# Patient Record
Sex: Male | Born: 1980 | Race: White | Hispanic: No | Marital: Single | Smoking: Current every day smoker
Health system: Southern US, Community
[De-identification: ages and names within clinical notes are randomized; demographics above are authoritative.]

## PROBLEM LIST (undated history)

## (undated) DIAGNOSIS — F32A Depression, unspecified: Secondary | ICD-10-CM

## (undated) DIAGNOSIS — F329 Major depressive disorder, single episode, unspecified: Secondary | ICD-10-CM

## (undated) DIAGNOSIS — N200 Calculus of kidney: Secondary | ICD-10-CM

---
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
## 2002-10-14 ENCOUNTER — Encounter: Payer: Self-pay | Admitting: *Deleted

## 2002-10-14 ENCOUNTER — Emergency Department (HOSPITAL_COMMUNITY): Admission: EM | Admit: 2002-10-14 | Discharge: 2002-10-14 | Payer: Self-pay | Admitting: *Deleted

## 2009-04-17 ENCOUNTER — Emergency Department (HOSPITAL_COMMUNITY): Admission: EM | Admit: 2009-04-17 | Discharge: 2009-04-17 | Payer: Self-pay | Admitting: Emergency Medicine

## 2009-09-30 ENCOUNTER — Emergency Department (HOSPITAL_COMMUNITY): Admission: EM | Admit: 2009-09-30 | Discharge: 2009-09-30 | Payer: Self-pay | Admitting: Emergency Medicine

## 2009-10-02 ENCOUNTER — Emergency Department (HOSPITAL_COMMUNITY): Admission: EM | Admit: 2009-10-02 | Discharge: 2009-10-02 | Payer: Self-pay | Admitting: Emergency Medicine

## 2012-09-22 ENCOUNTER — Emergency Department (HOSPITAL_COMMUNITY)
Admission: EM | Admit: 2012-09-22 | Discharge: 2012-09-22 | Disposition: A | Discharge status: Home / Self Care | Payer: Self-pay | Attending: Emergency Medicine | Admitting: Emergency Medicine

## 2012-09-22 ENCOUNTER — Emergency Department (HOSPITAL_COMMUNITY): Payer: Self-pay

## 2012-09-22 ENCOUNTER — Encounter (HOSPITAL_COMMUNITY): Payer: Self-pay | Admitting: *Deleted

## 2012-09-22 DIAGNOSIS — N23 Unspecified renal colic: Secondary | ICD-10-CM | POA: Insufficient documentation

## 2012-09-22 DIAGNOSIS — N200 Calculus of kidney: Secondary | ICD-10-CM | POA: Insufficient documentation

## 2012-09-22 DIAGNOSIS — R000 Tachycardia, unspecified: Secondary | ICD-10-CM | POA: Insufficient documentation

## 2012-09-22 DIAGNOSIS — R209 Unspecified disturbances of skin sensation: Secondary | ICD-10-CM | POA: Insufficient documentation

## 2012-09-22 DIAGNOSIS — R61 Generalized hyperhidrosis: Secondary | ICD-10-CM | POA: Insufficient documentation

## 2012-09-22 DIAGNOSIS — R319 Hematuria, unspecified: Secondary | ICD-10-CM | POA: Insufficient documentation

## 2012-09-22 DIAGNOSIS — F17200 Nicotine dependence, unspecified, uncomplicated: Secondary | ICD-10-CM | POA: Insufficient documentation

## 2012-09-22 DIAGNOSIS — Z880 Allergy status to penicillin: Secondary | ICD-10-CM | POA: Insufficient documentation

## 2012-09-22 DIAGNOSIS — R110 Nausea: Secondary | ICD-10-CM | POA: Insufficient documentation

## 2012-09-22 HISTORY — DX: Calculus of kidney: N20.0

## 2012-09-22 LAB — URINALYSIS, ROUTINE W REFLEX MICROSCOPIC
Ketones, ur: NEGATIVE mg/dL
Leukocytes, UA: NEGATIVE
Protein, ur: NEGATIVE mg/dL
Urobilinogen, UA: 0.2 mg/dL (ref 0.0–1.0)

## 2012-09-22 LAB — URINE MICROSCOPIC-ADD ON

## 2012-09-22 MED ORDER — FENTANYL CITRATE 0.05 MG/ML IJ SOLN: 50.0000 ug | Freq: Once | INTRAMUSCULAR | Status: AC

## 2012-09-22 MED ORDER — HYDROMORPHONE HCL PF 1 MG/ML IJ SOLN
1.0000 mg | Freq: Once | INTRAMUSCULAR | Status: AC
  Administered 2012-09-22: 1 mg via INTRAVENOUS
  Filled 2012-09-22: qty 1

## 2012-09-22 MED ORDER — FENTANYL CITRATE 0.05 MG/ML IJ SOLN
INTRAMUSCULAR | Status: AC
  Administered 2012-09-22: 50 ug via INTRAVENOUS
  Filled 2012-09-22: qty 2

## 2012-09-22 MED ORDER — METOCLOPRAMIDE HCL 10 MG PO TABS: 10.0000 mg | ORAL_TABLET | Freq: Four times a day (QID) | ORAL | Status: DC | PRN

## 2012-09-22 MED ORDER — METOCLOPRAMIDE HCL 5 MG/ML IJ SOLN
10.0000 mg | Freq: Once | INTRAMUSCULAR | Status: AC
  Administered 2012-09-22: 10 mg via INTRAVENOUS
  Filled 2012-09-22: qty 2

## 2012-09-22 MED ORDER — OXYCODONE-ACETAMINOPHEN 5-325 MG PO TABS: 2.0000 | ORAL_TABLET | ORAL | Status: DC | PRN

## 2012-09-22 MED ORDER — KETOROLAC TROMETHAMINE 30 MG/ML IJ SOLN
30.0000 mg | Freq: Once | INTRAMUSCULAR | Status: AC
  Administered 2012-09-22: 30 mg via INTRAVENOUS
  Filled 2012-09-22: qty 1

## 2012-09-22 MED ORDER — ONDANSETRON 8 MG PO TBDP: ORAL_TABLET | ORAL | Status: DC

## 2012-09-22 NOTE — ED Notes (Signed)
Sudden onset sharp left flank pain starting today.

## 2012-09-22 NOTE — ED Notes (Signed)
Dr. Bednar at bedside. 

## 2012-09-22 NOTE — ED Provider Notes (Signed)
History    This chart was scribed for Hurman Horn, MD by Quintella Reichert, ED scribe.  This patient was seen in room APA06/[facility]06 and the patient's care was started at 3:47 PM.   CSN: [id number]  Arrival date & time 09/22/12  1536      Chief Complaint  Patient presents with  . Flank Pain     The history is provided by the patient and a relative. No language interpreter was used.    HPI Comments: Derek Hester is a 32 y.o. male who presents to the Emergency Department complaining of constant, severe left-sided lower abdominal pain that began suddenly today when he was walking outside to the mailbox.  Pt's mother states that he collapsed when pain began, and she also notes that he is diaphoretic.  Pt also reports mild nausea and tingling to his legs and his left side and arm.  He denies emesis.  Pt denies h/o kidney stones or similar symptoms.  He has no chronic medical conditions and does not take medicines regularly.  He denies family h/o kidney stones to his knowledge.     History reviewed. No pertinent past medical history.  History reviewed. No pertinent past surgical history.  No family history on file.  History  Substance Use Topics  . Smoking status: Current Every Day Smoker    Types: Cigarettes  . Smokeless tobacco: Not on file  . Alcohol Use: Yes     Comment: occasional      Review of Systems 10 Systems reviewed and all are negative for acute change except as noted in the HPI.    Allergies  Penicillins  Home Medications   Current Outpatient Rx  Name  Route  Sig  Dispense  Refill  . metoCLOPramide (REGLAN) 10 MG tablet   Oral   Take 1 tablet (10 mg total) by mouth every 6 (six) hours as needed (nausea/headache).   6 tablet   0   . ondansetron (ZOFRAN ODT) 8 MG disintegrating tablet      8mg  ODT q4 hours prn nausea   4 tablet   0   . oxyCODONE-acetaminophen (PERCOCET) 5-325 MG per tablet   Oral   Take 2 tablets by mouth every 4 (four) hours  as needed for pain.   10 tablet   0     BP 141/110  Pulse 136  Resp 25  SpO2 96%  Physical Exam  Nursing note and vitals reviewed. Constitutional: He appears well-developed and well-nourished.  Awake, alert, nontoxic appearance.  Uncomfortable appearing.  Slightly diaphoretic.  HENT:  Head: Atraumatic.  Eyes: Right eye exhibits no discharge. Left eye exhibits no discharge.  Neck: Neck supple.  Cardiovascular: Regular rhythm and normal heart sounds.  Tachycardia present.   No murmur heard. Pulmonary/Chest: Effort normal and breath sounds normal. No respiratory distress. He has no wheezes. He has no rales. He exhibits no tenderness.  Abdominal: Soft. Bowel sounds are normal. He exhibits no mass (No palpable hernias). There is tenderness (Entire left side of abdomen, left CVA). There is no rebound.  Genitourinary: Testes normal.  Musculoskeletal: He exhibits no tenderness.  No midline back tenderness Baseline ROM, no obvious new focal weakness.  Neurological:  Mental status and motor strength appears baseline for patient and situation.  Skin: No rash noted. He is diaphoretic.  Psychiatric: He has a normal mood and affect.    ED Course  Procedures (including critical care time)  DIAGNOSTIC STUDIES: Oxygen Saturation is 96% on room air,  normal by my interpretation.    COORDINATION OF CARE: 3:54 PM-Discussed treatment plan which includes UA, pain medication, and imaging with pt at bedside and pt agreed to plan.   Patient and caregiver understand and agree with initial ED impression and plan with expectations set for ED visit.   Labs Reviewed  URINALYSIS, ROUTINE W REFLEX MICROSCOPIC - Abnormal; Notable for the following:    Specific Gravity, Urine >1.030 (*)    Hgb urine dipstick MODERATE (*)    All other components within normal limits  URINE MICROSCOPIC-ADD ON   Ct Abdomen Pelvis Wo Contrast  09/22/2012   *RADIOLOGY REPORT*  Clinical Data: Left flank pain  CT ABDOMEN  AND PELVIS WITHOUT CONTRAST  Technique:  Multidetector CT imaging of the abdomen and pelvis was performed following the standard protocol without intravenous contrast.  Comparison: None.  Findings: Lung bases are unremarkable.  Sagittal images of the spine are unremarkable.  Unenhanced liver, pancreas spleen and adrenals are unremarkable. No calcified gallstones are noted within gallbladder.  Unenhanced kidneys are symmetrical in size.  Nonobstructive calcified calculus upper pole of the right kidney measures 4.6 mm.  No hydronephrosis or hydroureter.  No calcified ureteral calculi are noted.  The urinary bladder is under distended limiting its assessment.  No calcified calculi are noted within urinary bladder.  The prostate gland and seminal vesicles are unremarkable.  No aortic aneurysm.  No small bowel obstruction.  No ascites or free air.  No pericecal inflammation.  Normal appendix is clearly visualized axial image 53.  IMPRESSION:  1.  There is right nonobstructive nephrolithiasis.  No hydronephrosis or hydroureter. 2.  No calcified ureteral calculi are noted. 3.  No pericecal inflammation.  Normal appendix.  Limited assessment of urinary bladder which is under distended.  No calcified calculi are noted within urinary bladder.   Original Report Authenticated By: Natasha Mead, M.D.     1. Renal colic on left side   2. Hematuria   3. Nephrolithiasis       MDM  I personally performed the services described in this documentation, which was scribed in my presence. The recorded information has been reviewed and is accurate.  Patient / Family / Caregiver informed of clinical course, understand medical decision-making process, and agree with plan. I doubt any other EMC precluding discharge at this time including, but not necessarily limited to the following:SBI.  Suspect passed stone.   Hurman Horn, MD 09/23/12 1310

## 2012-09-22 NOTE — ED Notes (Signed)
Pt states resolution of flank pain, at this time pt is awaiting results to CT scan, vitals WDL. Pt AOx4, comfortably, no needs voiced. Informed pt that EDP will be in to share results when they become available.

## 2013-02-04 ENCOUNTER — Emergency Department (HOSPITAL_COMMUNITY): Payer: Self-pay

## 2013-02-04 ENCOUNTER — Encounter (HOSPITAL_COMMUNITY): Payer: Self-pay | Admitting: Emergency Medicine

## 2013-02-04 ENCOUNTER — Emergency Department (HOSPITAL_COMMUNITY)
Admission: EM | Admit: 2013-02-04 | Discharge: 2013-02-04 | Disposition: A | Discharge status: Home / Self Care | Payer: Self-pay | Attending: Emergency Medicine | Admitting: Emergency Medicine

## 2013-02-04 DIAGNOSIS — F17200 Nicotine dependence, unspecified, uncomplicated: Secondary | ICD-10-CM | POA: Insufficient documentation

## 2013-02-04 DIAGNOSIS — Z87442 Personal history of urinary calculi: Secondary | ICD-10-CM | POA: Insufficient documentation

## 2013-02-04 DIAGNOSIS — N132 Hydronephrosis with renal and ureteral calculous obstruction: Secondary | ICD-10-CM

## 2013-02-04 DIAGNOSIS — N1330 Unspecified hydronephrosis: Secondary | ICD-10-CM | POA: Insufficient documentation

## 2013-02-04 DIAGNOSIS — N23 Unspecified renal colic: Secondary | ICD-10-CM | POA: Insufficient documentation

## 2013-02-04 DIAGNOSIS — R112 Nausea with vomiting, unspecified: Secondary | ICD-10-CM | POA: Insufficient documentation

## 2013-02-04 DIAGNOSIS — Z880 Allergy status to penicillin: Secondary | ICD-10-CM | POA: Insufficient documentation

## 2013-02-04 DIAGNOSIS — N201 Calculus of ureter: Secondary | ICD-10-CM | POA: Insufficient documentation

## 2013-02-04 HISTORY — DX: Calculus of kidney: N20.0

## 2013-02-04 LAB — URINALYSIS, ROUTINE W REFLEX MICROSCOPIC
Glucose, UA: NEGATIVE mg/dL
Leukocytes, UA: NEGATIVE
Nitrite: NEGATIVE
Specific Gravity, Urine: >1.03 — ABNORMAL HIGH (ref 1.005–1.030)
pH: 5.5 (ref 5.0–8.0)

## 2013-02-04 LAB — CBC WITH DIFFERENTIAL/PLATELET
Basophils Absolute: 0.1 10*3/uL (ref 0.0–0.1)
Eosinophils Relative: 2 % (ref 0–5)
HCT: 47.5 % (ref 39.0–52.0)
Hemoglobin: 16.9 g/dL (ref 13.0–17.0)
Lymphocytes Relative: 29 % (ref 12–46)
Lymphs Abs: 3.7 10*3/uL (ref 0.7–4.0)
MCV: 93.5 fL (ref 78.0–100.0)
Monocytes Absolute: 0.9 10*3/uL (ref 0.1–1.0)
Monocytes Relative: 7 % (ref 3–12)
Neutro Abs: 8 10*3/uL — ABNORMAL HIGH (ref 1.7–7.7)
RBC: 5.08 MIL/uL (ref 4.22–5.81)
WBC: 12.8 10*3/uL — ABNORMAL HIGH (ref 4.0–10.5)

## 2013-02-04 LAB — BASIC METABOLIC PANEL
CO2: 26 mEq/L (ref 19–32)
Chloride: 98 mEq/L (ref 96–112)
Creatinine, Ser: 1.1 mg/dL (ref 0.50–1.35)
GFR calc Af Amer: >90 mL/min (ref >90)
Potassium: 3.4 mEq/L — ABNORMAL LOW (ref 3.5–5.1)

## 2013-02-04 MED ORDER — SODIUM CHLORIDE 0.9 % IV BOLUS (SEPSIS)
1000.0000 mL | Freq: Once | INTRAVENOUS | Status: AC
  Administered 2013-02-04: 1000 mL via INTRAVENOUS

## 2013-02-04 MED ORDER — OXYCODONE-ACETAMINOPHEN 5-325 MG PO TABS: 1.0000 | ORAL_TABLET | Freq: Once | ORAL | Status: DC

## 2013-02-04 MED ORDER — HYDROMORPHONE HCL PF 1 MG/ML IJ SOLN
1.0000 mg | Freq: Once | INTRAMUSCULAR | Status: AC
  Administered 2013-02-04: 1 mg via INTRAVENOUS
  Filled 2013-02-04: qty 1

## 2013-02-04 MED ORDER — ONDANSETRON HCL 4 MG/2ML IJ SOLN
4.0000 mg | Freq: Once | INTRAMUSCULAR | Status: AC
  Administered 2013-02-04: 4 mg via INTRAVENOUS
  Filled 2013-02-04: qty 2

## 2013-02-04 MED ORDER — ONDANSETRON 8 MG PO TBDP: 8.0000 mg | ORAL_TABLET | Freq: Once | ORAL | Status: DC

## 2013-02-04 MED ORDER — ONDANSETRON HCL 8 MG PO TABS: 8.0000 mg | ORAL_TABLET | Freq: Three times a day (TID) | ORAL | Status: DC | PRN

## 2013-02-04 MED ORDER — OXYCODONE-ACETAMINOPHEN 5-325 MG PO TABS
1.0000 | ORAL_TABLET | Freq: Once | ORAL | Status: AC
  Administered 2013-02-04: 1 via ORAL
  Filled 2013-02-04: qty 1

## 2013-02-04 MED ORDER — IOHEXOL 300 MG/ML  SOLN
100.0000 mL | Freq: Once | INTRAMUSCULAR | Status: AC | PRN
  Administered 2013-02-04: 100 mL via INTRAVENOUS

## 2013-02-04 MED ORDER — OXYCODONE-ACETAMINOPHEN 5-325 MG PO TABS: 1.0000 | ORAL_TABLET | ORAL | Status: DC | PRN

## 2013-02-04 MED ORDER — IOHEXOL 300 MG/ML  SOLN
50.0000 mL | Freq: Once | INTRAMUSCULAR | Status: AC | PRN
  Administered 2013-02-04: 50 mL via ORAL

## 2013-02-04 NOTE — ED Notes (Signed)
Escalating, banging feet, hyperventilating, gagging loudly. Vomited small amt. Clear liquid. Mildly diaphoretic.

## 2013-02-04 NOTE — ED Provider Notes (Signed)
CSN: [id number]     Arrival date & time 02/04/13  0249 History   First MD Initiated Contact with Patient 02/04/13 0302     Chief Complaint  Patient presents with  . Flank Pain    Patient is a 32 y.o. male presenting with flank pain. The history is provided by the patient and a significant other.  Flank Pain This is a new problem. The current episode started 1 to 2 hours ago. The problem occurs constantly. The problem has been gradually worsening. Associated symptoms include abdominal pain. Pertinent negatives include no chest pain and no shortness of breath. Exacerbated by: palpation, movement. The symptoms are relieved by rest. He has tried rest for the symptoms. The treatment provided mild relief.  pt reports sudden onset of right flank pain just prior to arrival He reports he was rest when it occurred No cp/sob He reports nausea/vomiting Reports similar to prior episodes of kidney stones   Past Medical History  Diagnosis Date  . Renal calculi 09/22/2012   History reviewed. No pertinent past surgical history. No family history on file. History  Substance Use Topics  . Smoking status: Current Every Day Smoker    Types: Cigarettes  . Smokeless tobacco: Not on file  . Alcohol Use: Yes     Comment: occasional    Review of Systems  Respiratory: Negative for shortness of breath.   Cardiovascular: Negative for chest pain.  Gastrointestinal: Positive for abdominal pain.  Genitourinary: Positive for flank pain.  All other systems reviewed and are negative.    Allergies  Penicillins  Home Medications   Current Outpatient Rx  Name  Route  Sig  Dispense  Refill  . metoCLOPramide (REGLAN) 10 MG tablet   Oral   Take 1 tablet (10 mg total) by mouth every 6 (six) hours as needed (nausea/headache).   6 tablet   0   . ondansetron (ZOFRAN ODT) 8 MG disintegrating tablet      8mg  ODT q4 hours prn nausea   4 tablet   0   . oxyCODONE-acetaminophen (PERCOCET) 5-325 MG per  tablet   Oral   Take 2 tablets by mouth every 4 (four) hours as needed for pain.   10 tablet   0    BP 142/82  Pulse 72  Temp(Src) 97.6 F (36.4 C) (Oral)  Resp 22  SpO2 100% Physical Exam CONSTITUTIONAL: Well developed/well nourished HEAD: Normocephalic/atraumatic EYES: EOMI/PERRL ENMT: Mucous membranes moist NECK: supple no meningeal signs SPINE:entire spine nontender CV: S1/S2 noted, no murmurs/rubs/gallops noted LUNGS: Lungs are clear to auscultation bilaterally, no apparent distress ABDOMEN: soft, diffuse tenderness noted throughout abdomen, no rebound or guarding WU:[id number] cva tenderness, no inguinal hernia, no scrotal tenderness noted, significant other present at patient request NEURO: Pt is awake/alert, moves all extremitiesx4 EXTREMITIES: pulses normal, full ROM SKIN: warm, color normal PSYCH: anxious  ED Course  Procedures  Labs Review Labs Reviewed  URINALYSIS, ROUTINE W REFLEX MICROSCOPIC - Abnormal; Notable for the following:    Specific Gravity, Urine >1.030 (*)    Ketones, ur TRACE (*)    All other components within normal limits   Imaging Review No results found.  EKG Interpretation   None       4:08 AM Pt still in excruciating pain He has significant tenderness to RLQ.  He has no blood in his urine.  I am not convinced this is a ureteral stone Will need to proceed with CT imaging 6:20 AM Ct scan shows ureteral stone Pt  feels improved, resting comfortably I feel he is safe/stable for d/c home Discussed need for f/u with urology    MDM  No diagnosis found. Nursing notes including past medical history and social history reviewed and considered in documentation Labs/vital reviewed and considered Previous records reviewed and considered - previous ED visits reviewed     Joya Gaskins, MD 02/04/13 [phone]

## 2013-02-04 NOTE — ED Notes (Signed)
Pt states that the pain is starting to return, pain is rated as a 6 on pain scale, Dr Bebe Shaggy notified, additional orders given

## 2013-02-04 NOTE — ED Notes (Signed)
Sudden onset pain left flank/cva with nausea

## 2013-12-20 ENCOUNTER — Encounter (HOSPITAL_COMMUNITY): Payer: Self-pay | Admitting: Emergency Medicine

## 2013-12-20 ENCOUNTER — Inpatient Hospital Stay (HOSPITAL_COMMUNITY)
Admission: EM | Admit: 2013-12-20 | Discharge: 2013-12-21 | DRG: 918 | Disposition: A | Discharge status: Home / Self Care | Payer: Self-pay | Attending: Internal Medicine | Admitting: Internal Medicine

## 2013-12-20 DIAGNOSIS — F17200 Nicotine dependence, unspecified, uncomplicated: Secondary | ICD-10-CM | POA: Diagnosis present

## 2013-12-20 DIAGNOSIS — Z23 Encounter for immunization: Secondary | ICD-10-CM

## 2013-12-20 DIAGNOSIS — T443X1A Poisoning by other parasympatholytics [anticholinergics and antimuscarinics] and spasmolytics, accidental (unintentional), initial encounter: Secondary | ICD-10-CM | POA: Diagnosis present

## 2013-12-20 DIAGNOSIS — E872 Acidosis, unspecified: Secondary | ICD-10-CM | POA: Diagnosis present

## 2013-12-20 DIAGNOSIS — T391X1A Poisoning by 4-Aminophenol derivatives, accidental (unintentional), initial encounter: Principal | ICD-10-CM | POA: Diagnosis present

## 2013-12-20 DIAGNOSIS — E876 Hypokalemia: Secondary | ICD-10-CM | POA: Diagnosis present

## 2013-12-20 DIAGNOSIS — T443X4A Poisoning by other parasympatholytics [anticholinergics and antimuscarinics] and spasmolytics, undetermined, initial encounter: Secondary | ICD-10-CM

## 2013-12-20 DIAGNOSIS — T40601A Poisoning by unspecified narcotics, accidental (unintentional), initial encounter: Secondary | ICD-10-CM | POA: Diagnosis present

## 2013-12-20 DIAGNOSIS — T398X2A Poisoning by other nonopioid analgesics and antipyretics, not elsewhere classified, intentional self-harm, initial encounter: Secondary | ICD-10-CM

## 2013-12-20 DIAGNOSIS — F4320 Adjustment disorder, unspecified: Secondary | ICD-10-CM | POA: Diagnosis present

## 2013-12-20 DIAGNOSIS — D72829 Elevated white blood cell count, unspecified: Secondary | ICD-10-CM | POA: Diagnosis present

## 2013-12-20 DIAGNOSIS — T391X2A Poisoning by 4-Aminophenol derivatives, intentional self-harm, initial encounter: Secondary | ICD-10-CM

## 2013-12-20 DIAGNOSIS — T50902A Poisoning by unspecified drugs, medicaments and biological substances, intentional self-harm, initial encounter: Secondary | ICD-10-CM | POA: Diagnosis present

## 2013-12-20 DIAGNOSIS — R9431 Abnormal electrocardiogram [ECG] [EKG]: Secondary | ICD-10-CM | POA: Diagnosis present

## 2013-12-20 DIAGNOSIS — T394X2A Poisoning by antirheumatics, not elsewhere classified, intentional self-harm, initial encounter: Secondary | ICD-10-CM | POA: Diagnosis present

## 2013-12-20 DIAGNOSIS — F4323 Adjustment disorder with mixed anxiety and depressed mood: Secondary | ICD-10-CM | POA: Diagnosis present

## 2013-12-20 HISTORY — DX: Major depressive disorder, single episode, unspecified: F32.9

## 2013-12-20 HISTORY — DX: Depression, unspecified: F32.A

## 2013-12-20 LAB — CBC WITH DIFFERENTIAL/PLATELET
BASOS ABS: 0 10*3/uL (ref 0.0–0.1)
Basophils Relative: 0 % (ref 0–1)
EOS PCT: 0 % (ref 0–5)
Eosinophils Absolute: 0 10*3/uL (ref 0.0–0.7)
HCT: 47.2 % (ref 39.0–52.0)
Hemoglobin: 17 g/dL (ref 13.0–17.0)
LYMPHS PCT: 5 % — AB (ref 12–46)
Lymphs Abs: 0.7 10*3/uL (ref 0.7–4.0)
MCH: 33.4 pg (ref 26.0–34.0)
MCHC: 36 g/dL (ref 30.0–36.0)
MCV: 92.7 fL (ref 78.0–100.0)
Monocytes Absolute: 0.4 10*3/uL (ref 0.1–1.0)
Monocytes Relative: 2 % — ABNORMAL LOW (ref 3–12)
NEUTROS ABS: 14.7 10*3/uL — AB (ref 1.7–7.7)
Neutrophils Relative %: 93 % — ABNORMAL HIGH (ref 43–77)
PLATELETS: 206 10*3/uL (ref 150–400)
RBC: 5.09 MIL/uL (ref 4.22–5.81)
RDW: 12.3 % (ref 11.5–15.5)
WBC: 15.9 10*3/uL — AB (ref 4.0–10.5)

## 2013-12-20 LAB — COMPREHENSIVE METABOLIC PANEL
ALK PHOS: 68 U/L (ref 39–117)
ALT: 20 U/L (ref 0–53)
AST: 20 U/L (ref 0–37)
Albumin: 4.6 g/dL (ref 3.5–5.2)
Anion gap: 17 — ABNORMAL HIGH (ref 5–15)
BILIRUBIN TOTAL: 0.4 mg/dL (ref 0.3–1.2)
BUN: 8 mg/dL (ref 6–23)
CHLORIDE: 100 meq/L (ref 96–112)
CO2: 22 meq/L (ref 19–32)
CREATININE: 0.96 mg/dL (ref 0.50–1.35)
Calcium: 9.1 mg/dL (ref 8.4–10.5)
GFR calc Af Amer: >90 mL/min (ref >90)
Glucose, Bld: 189 mg/dL — ABNORMAL HIGH (ref 70–99)
Potassium: 3.9 mEq/L (ref 3.7–5.3)
Sodium: 139 mEq/L (ref 137–147)
Total Protein: 8.1 g/dL (ref 6.0–8.3)

## 2013-12-20 LAB — ETHANOL

## 2013-12-20 LAB — RAPID URINE DRUG SCREEN, HOSP PERFORMED
AMPHETAMINES: NOT DETECTED
Barbiturates: NOT DETECTED
Benzodiazepines: NOT DETECTED
Cocaine: NOT DETECTED
OPIATES: NOT DETECTED
Tetrahydrocannabinol: NOT DETECTED

## 2013-12-20 LAB — ACETAMINOPHEN LEVEL: Acetaminophen (Tylenol), Serum: 189.9 ug/mL (ref 10–30)

## 2013-12-20 LAB — TROPONIN I: Troponin I: <0.3 ng/mL (ref <0.30)

## 2013-12-20 LAB — PROTIME-INR
INR: 0.94 (ref 0.00–1.49)
PROTHROMBIN TIME: 12.6 s (ref 11.6–15.2)

## 2013-12-20 LAB — SALICYLATE LEVEL

## 2013-12-20 LAB — MAGNESIUM: Magnesium: 1.8 mg/dL (ref 1.5–2.5)

## 2013-12-20 MED ORDER — HEPARIN SODIUM (PORCINE) 5000 UNIT/ML IJ SOLN: 5000.0000 [IU] | Freq: Three times a day (TID) | INTRAMUSCULAR | Status: DC

## 2013-12-20 MED ORDER — SODIUM CHLORIDE 0.9 % IV SOLN
INTRAVENOUS | Status: DC
  Administered 2013-12-20 – 2013-12-21 (×2): via INTRAVENOUS

## 2013-12-20 MED ORDER — ONDANSETRON HCL 4 MG/2ML IJ SOLN: 4.0000 mg | Freq: Once | INTRAMUSCULAR | Status: DC

## 2013-12-20 MED ORDER — ACETYLCYSTEINE LOAD VIA INFUSION
150.0000 mg/kg | Freq: Once | INTRAVENOUS | Status: AC
  Administered 2013-12-20: 11685 mg via INTRAVENOUS
  Filled 2013-12-20: qty 293

## 2013-12-20 MED ORDER — METOCLOPRAMIDE HCL 5 MG/ML IJ SOLN: 10.0000 mg | Freq: Once | INTRAMUSCULAR | Status: DC

## 2013-12-20 MED ORDER — ACETYLCYSTEINE 200 MG/ML IV SOLN
INTRAVENOUS | Status: AC
  Filled 2013-12-20: qty 30

## 2013-12-20 MED ORDER — ACETYLCYSTEINE 200 MG/ML IV SOLN
INTRAVENOUS | Status: AC
  Filled 2013-12-20: qty 180

## 2013-12-20 MED ORDER — SODIUM CHLORIDE 0.9 % IJ SOLN: 3.0000 mL | Freq: Two times a day (BID) | INTRAMUSCULAR | Status: DC

## 2013-12-20 MED ORDER — NALOXONE HCL 0.4 MG/ML IJ SOLN: 0.4000 mg | INTRAMUSCULAR | Status: DC | PRN

## 2013-12-20 MED ORDER — SODIUM CHLORIDE 0.9 % IV SOLN: INTRAVENOUS | Status: DC

## 2013-12-20 MED ORDER — SODIUM CHLORIDE 0.9 % IV BOLUS (SEPSIS)
1000.0000 mL | Freq: Once | INTRAVENOUS | Status: AC
  Administered 2013-12-20: 1000 mL via INTRAVENOUS

## 2013-12-20 MED ORDER — ACETYLCYSTEINE 200 MG/ML IV SOLN
15.0000 mg/kg/h | INTRAVENOUS | Status: DC
  Administered 2013-12-20: 15 mg/kg/h via INTRAVENOUS
  Filled 2013-12-20 (×4): qty 200

## 2013-12-20 MED ORDER — LORAZEPAM 2 MG/ML IJ SOLN: 2.0000 mg | Freq: Once | INTRAMUSCULAR | Status: AC | PRN

## 2013-12-20 MED ORDER — SODIUM CHLORIDE 0.45 % IV SOLN
INTRAVENOUS | Status: DC
Start: 2013-12-20 — End: 2013-12-21
  Administered 2013-12-20 – 2013-12-21 (×2): via INTRAVENOUS

## 2013-12-20 NOTE — ED Notes (Signed)
Pharmacy called to speak to me about the patient, regarding acetadote.

## 2013-12-20 NOTE — Progress Notes (Signed)
MEDICATION RELATED CONSULT NOTE - INITIAL   Pharmacy Consult for IV Acetylcysteine Indication: Tylenol OD  Allergies  Allergen Reactions  . Penicillins    Patient Measurements: Weight: 171 lb 12.8 oz (77.928 kg)  Vital Signs: Temp: 98.6 F (37 C) (09/10 1738) Temp src: Oral (09/10 1738) BP: 162/92 mmHg (09/10 1738) Pulse Rate: 126 (09/10 1738) Intake/Output from previous day:   Intake/Output from this shift:    Labs:  Recent Labs  12/20/13 1823  WBC 15.9*  HGB 17.0  HCT 47.2  PLT 206  CREATININE 0.96  MG 1.8  ALBUMIN 4.6  PROT 8.1  AST 20  ALT 20  ALKPHOS 68  BILITOT 0.4   CrCl is unknown because there is no height on file for the current visit.  Acetaminophen (Tylenol), Serum Acetaminophen level       Collected: 12/20/13 1823   Resulting lab: SUNQUEST   Reference range: 10 - 30 ug/mL   Value: 189.9 (High Panic)   Medical History: Past Medical History  Diagnosis Date  . Renal calculi 09/22/2012   Medications:  Scheduled:  . metoCLOPramide (REGLAN) injection  10 mg Intravenous Once  . ondansetron (ZOFRAN) IV  4 mg Intravenous Once   Infusions:  . sodium chloride    . acetylcysteine     Followed by  . acetylcysteine      Assessment: Okay for Protocol, 33 yo male presents with elevated APAP level s/p ingestion of Percocet and Tylenol > 4 hours from admission.  [place] Poison control already consulted.  Goal of Therapy:  Preserve organ function.  Plan:  IV Acetylcysteine per protocol. AM labs to include CMET, PT/INR and Tylenol LVL.  Lamonte Richer R 12/20/2013,7:48 PM

## 2013-12-20 NOTE — ED Provider Notes (Signed)
CSN: [id number]     Arrival date & time 12/20/13  1730 History   First MD Initiated Contact with Patient 12/20/13 1748     Chief Complaint  Patient presents with  . V70.1      The history is provided by the patient and the police. The history is limited by the condition of the patient (Uncooperative).  Pt was seen at 1800. Per Police and pt report: pt brought in by Police for OD on multiple OTC and rx medications this afternoon. Pt states his "girlfriend was leaving with my son," so he began to drink etoh and take pills starting approximately 1230 today. Pt's family came home, saw pt, and called Police. Pt took unknown quantities of tylenol PM, percocet, and ibuprofen. Pt refuses to answer any further questions.   Past Medical History  Diagnosis Date  . Renal calculi 09/22/2012   History reviewed. No pertinent past surgical history.  History  Substance Use Topics  . Smoking status: Current Every Day Smoker    Types: Cigarettes  . Smokeless tobacco: Not on file  . Alcohol Use: Yes     Comment: occasional    Review of Systems  Unable to perform ROS: Psychiatric disorder      Allergies  Penicillins  Home Medications   Prior to Admission medications   Medication Sig Start Date End Date Taking? Authorizing Provider  diphenhydramine-acetaminophen (TYLENOL PM) 25-500 MG TABS Take 2 tablets by mouth at bedtime as needed (for sleep/pain).   Yes Historical Provider, MD  L-LYSINE PO Take 1 capsule by mouth daily as needed (for cold sores).   Yes Historical Provider, MD   BP 162/92  Pulse 126  Temp(Src) 98.6 F (37 C) (Oral)  Resp 20  Wt 171 lb 12.8 oz (77.928 kg)  SpO2 98% Physical Exam 1805; Physical examination:  Nursing notes reviewed; Vital signs and O2 SAT reviewed;  Constitutional: Well developed, Well nourished, In no acute distress; Head:  Normocephalic, atraumatic; Eyes: EOMI, PERRL, No scleral icterus; ENMT: Mouth and pharynx normal, Mucous membranes dry; Neck:  Supple, Full range of motion, No lymphadenopathy; Cardiovascular: Tachycardic rate and rhythm, No gallop; Respiratory: Breath sounds clear & equal bilaterally, No rales, rhonchi, wheezes.  Speaking full sentences with ease, Normal respiratory effort/excursion; Chest: Nontender, Movement normal; Abdomen: Soft, Nontender, Nondistended, Normal bowel sounds; Genitourinary: No CVA tenderness; Extremities: Pulses normal, No tenderness, No edema, No calf edema or asymmetry.; Neuro: AA&Ox3, Major CN grossly intact.  Speech clear. No gross focal motor or sensory deficits in extremities.; Skin: Color normal, Warm, Dry.; Psych:  Affect flat, poor eye contact. Refuses to answer most questions.     ED Course  Procedures     EKG Interpretation   Date/Time:  Thursday December 20 2013 18:01:52 EDT Ventricular Rate:  113 PR Interval:  105 QRS Duration: 91 QT Interval:  421 QTC Calculation: 577 R Axis:   95 Text Interpretation:  Sinus tachycardia Borderline right axis deviation  LVH by voltage Nonspecific ST and T wave abnormality Prolonged QT interval  Baseline wander Confirmed by [facility]  MD, Nicholos Johns [id number]) on 12/20/2013  6:12:59 PM      MDM  MDM Reviewed: previous chart, nursing note and vitals Reviewed previous: labs Interpretation: labs and ECG Total time providing critical care: 30-74 minutes. This excludes time spent performing separately reportable procedures and services. Consults: admitting MD   CRITICAL CARE Performed by: Laray Anger Total critical care time: 35 Critical care time was exclusive of separately billable procedures and treating  other patients. Critical care was necessary to treat or prevent imminent or life-threatening deterioration. Critical care was time spent personally by me on the following activities: development of treatment plan with patient and/or surrogate as well as nursing, discussions with consultants, evaluation of patient's response to treatment,  examination of patient, obtaining history from patient or surrogate, ordering and performing treatments and interventions, ordering and review of laboratory studies, ordering and review of radiographic studies, pulse oximetry and re-evaluation of patient's condition.  Results for orders placed during the hospital encounter of 12/20/13  ETHANOL      Result Value Ref Range   Alcohol, Ethyl (B) <11  0 - 11 mg/dL  CBC WITH DIFFERENTIAL      Result Value Ref Range   WBC 15.9 (*) 4.0 - 10.5 K/uL   RBC 5.09  4.22 - 5.81 MIL/uL   Hemoglobin 17.0  13.0 - 17.0 g/dL   HCT 69.6  29.5 - 28.4 %   MCV 92.7  78.0 - 100.0 fL   MCH 33.4  26.0 - 34.0 pg   MCHC 36.0  30.0 - 36.0 g/dL   RDW 13.2  44.0 - 10.2 %   Platelets 206  150 - 400 K/uL   Neutrophils Relative % 93 (*) 43 - 77 %   Neutro Abs 14.7 (*) 1.7 - 7.7 K/uL   Lymphocytes Relative 5 (*) 12 - 46 %   Lymphs Abs 0.7  0.7 - 4.0 K/uL   Monocytes Relative 2 (*) 3 - 12 %   Monocytes Absolute 0.4  0.1 - 1.0 K/uL   Eosinophils Relative 0  0 - 5 %   Eosinophils Absolute 0.0  0.0 - 0.7 K/uL   Basophils Relative 0  0 - 1 %   Basophils Absolute 0.0  0.0 - 0.1 K/uL  URINE RAPID DRUG SCREEN (HOSP PERFORMED)      Result Value Ref Range   Opiates NONE DETECTED  NONE DETECTED   Cocaine NONE DETECTED  NONE DETECTED   Benzodiazepines NONE DETECTED  NONE DETECTED   Amphetamines NONE DETECTED  NONE DETECTED   Tetrahydrocannabinol NONE DETECTED  NONE DETECTED   Barbiturates NONE DETECTED  NONE DETECTED  ACETAMINOPHEN LEVEL      Result Value Ref Range   Acetaminophen (Tylenol), Serum 189.9 (*) 10 - 30 ug/mL  SALICYLATE LEVEL      Result Value Ref Range   Salicylate Lvl <2.0 (*) 2.8 - 20.0 mg/dL  COMPREHENSIVE METABOLIC PANEL      Result Value Ref Range   Sodium 139  137 - 147 mEq/L   Potassium 3.9  3.7 - 5.3 mEq/L   Chloride 100  96 - 112 mEq/L   CO2 22  19 - 32 mEq/L   Glucose, Bld 189 (*) 70 - 99 mg/dL   BUN 8  6 - 23 mg/dL   Creatinine, Ser 7.25   0.50 - 1.35 mg/dL   Calcium 9.1  8.4 - 36.6 mg/dL   Total Protein 8.1  6.0 - 8.3 g/dL   Albumin 4.6  3.5 - 5.2 g/dL   AST 20  0 - 37 U/L   ALT 20  0 - 53 U/L   Alkaline Phosphatase 68  39 - 117 U/L   Total Bilirubin 0.4  0.3 - 1.2 mg/dL   GFR calc non Af Amer >90  >90 mL/min   GFR calc Af Amer >90  >90 mL/min   Anion gap 17 (*) 5 - 15  PROTIME-INR  Result Value Ref Range   Prothrombin Time 12.6  11.6 - 15.2 seconds   INR 0.94  0.00 - 1.49  TROPONIN I      Result Value Ref Range   Troponin I <0.30  <0.30 ng/mL  MAGNESIUM      Result Value Ref Range   Magnesium 1.8  1.5 - 2.5 mg/dL    4098:  After arrival to ED, pt was refusing all care, stating he was "gonna leave." IVC paperwork completed. Tylenol elevated; will start IV NAC. IVF given for tachycardia.  QT prolonged on EKG; avoid zofran. T/C to [facility] Dr. Margot Ables, case discussed, including:  HPI, pertinent PM/SHx, VS/PE, dx testing, ED course and treatment:  Agreeable to admit, requests to write temporary orders, obtain stepdown bed to team 2.       Samuel Jester, DO 12/21/13 1555

## 2013-12-20 NOTE — Plan of Care (Signed)
Problem: Phase I Progression Outcomes Goal: Pain controlled with appropriate interventions Outcome: Progressing No complaints of pain at this time Goal: OOB as tolerated unless otherwise ordered Outcome: Completed/Met Date Met:  12/20/13 Independently moves around room Goal: Initial discharge plan identified Outcome: Progressing With Deputies Goal: Voiding-avoid urinary catheter unless indicated Outcome: Progressing Voids in urinal without difficulty  Goal: Hemodynamically stable Outcome: Progressing Vital signs are stable without the aid of medications

## 2013-12-20 NOTE — ED Notes (Signed)
IVC paperwork completed.  

## 2013-12-20 NOTE — ED Notes (Signed)
Spoke with Derek Hester at [facility]. Requested information provided. Recommends following: Acetaminophen level CMP IV fluids Magnesium level Supportive care Watch for CNS depression, seizures.

## 2013-12-20 NOTE — ED Notes (Signed)
Overdose.motrin 800,percocet, and tylenol  Brought in by [facility].  Took meds 4 hours pta.  Says he vomited pta.  Has been drinking etoh also.  Slurred speech

## 2013-12-20 NOTE — ED Notes (Signed)
Patient had been drinking water and then he vomited while admitting physician at bedside.

## 2013-12-20 NOTE — ED Notes (Signed)
Patient arrives with [employer], stating he took medication "to relax" Arrives with 2 empty bottles of percocet, on prescribed in 03/2012 and one prescribed in 01/2013. Unknown prior quantities in bottles prior to patient taking some. Mostly empty bottle of generic Tylenol PM, again with unknown prior quantity. Also arrives with mostly full bottle of Ibuprofen 600 mg prescribed to a [employer]da [employer]. All other prescriptions in patient's name. + ETOH, slurred speech. Patient awake.

## 2013-12-20 NOTE — H&P (Signed)
[facility] History and Physical  ZED WANNINGER ZOX:[id number] DOB: 1980-12-20 DOA: 12/20/2013  Referring physician: Dr Clarene Duke APED PCP: No PCP Per Patient  Specialists: none  Chief Complaint: intentional OD  Assessment/Plan Active Problems:   Intentional drug overdose   Prolonged QT interval   Poisoning by anticholinergic drug  Leukocytosis  Metabolic acidosis  Overdose: Pt 7 hrs out from overdose. Acetominophen level 189.9, ETOH level nml. ASA nml, UDS neg. EKG w/ prolonged QTC. Pt feeling very hot, and agitated - likely from anticholinergic overdose from Benadryl.  - Admit to step down - IVC paperwork signed in ED by ED staff - Sitter - Tele - consider Mg if develops unstable arrythmia - Ativan PRN szr - CIWA protocol - SW consult - psych consult - NAC per pharmacy for tylenol overdose - Narcan PRN - close monitoring for airway compromise and consider intubation if needed.  - IVF 174ml/hr 1/2NS - tylenol level in am - zofran PRN nausea - NPO  Leukocytosis: likely secondary to overdose and stress response.  - CBC in am  Metabolic acidosis: see above. Gap 17 on admission - CMET Q6x4 - consider Bicarb if significantly worsens  DVT Prophylaxis: Hep Crary TID  Code Status: FULL Family Communication: Mother present at time of admission Disposition Plan: pending clinical improvement and detox  HPI: Derek Hester is a 33 y.o. male came to [facility] ed 12/20/2013 with  Drug overdose. Pt became upset today at girlfriend leaving him adn taking 75mo old son with her. He decided to [facility] out and took and unknown quantity of hydrocodone and tylenol PM, and 1 beer and 1-2 shots of liquor. Pt spoke to mother about this and EMS was called. Pt took pills around 1:30. Denies SI/HI, but is very upset about being brought to the hospital against "his constitutional right." Denies CP, palpitations, SOB, syncope, HA, dry mouth, nausea, emesis. abd pain  Review of Systems: Per HPI w/ all  other systems negative.   Past Medical History  Diagnosis Date  . Renal calculi 09/22/2012   History reviewed. No pertinent past surgical history. Social History:  History   Social History Narrative  . No narrative on file    Allergies  Allergen Reactions  . Penicillins     History reviewed. No pertinent family history.  Prior to Admission medications   Medication Sig Start Date End Date Taking? Authorizing Provider  diphenhydramine-acetaminophen (TYLENOL PM) 25-500 MG TABS Take 2 tablets by mouth at bedtime as needed (for sleep/pain).   Yes Historical Provider, MD  L-LYSINE PO Take 1 capsule by mouth daily as needed (for cold sores).   Yes Historical Provider, MD   Physical Exam: Filed Vitals:   12/20/13 1738 12/20/13 2004 12/20/13 2010  BP: 162/92  144/81  Pulse: 126  120  Temp: 98.6 F (37 C)    TempSrc: Oral    Resp: 20  16  Height:  6' (1.829 m)   Weight: 77.928 kg (171 lb 12.8 oz) 77.928 kg (171 lb 12.8 oz)   SpO2: 98%  100%     General:  Agitated  Eyes: EOMI, PERRL  ENT: dry mucus membranes  Neck: FROM  Cardiovascular: RRR, no m/r/g  Respiratory: CTAB, nml effort  Abdomen: NABS, non-ttp  Skin: dry, intact  Musculoskeletal: no edema  Psychiatric: agitated, rational thought process, AOx3  Neurologic: CN2-12 grossly intact. Moves extremities in coordinated/symmetrical effort.   Labs on Admission:  Basic Metabolic Panel:  Recent Labs Lab 12/20/13 1823  NA 139  K 3.9  CL 100  CO2 22  GLUCOSE 189*  BUN 8  CREATININE 0.96  CALCIUM 9.1  MG 1.8   Liver Function Tests:  Recent Labs Lab 12/20/13 1823  AST 20  ALT 20  ALKPHOS 68  BILITOT 0.4  PROT 8.1  ALBUMIN 4.6   No results found for this basename: LIPASE, AMYLASE,  in the last 168 hours No results found for this basename: AMMONIA,  in the last 168 hours CBC:  Recent Labs Lab 12/20/13 1823  WBC 15.9*  NEUTROABS 14.7*  HGB 17.0  HCT 47.2  MCV 92.7  PLT 206   Cardiac  Enzymes:  Recent Labs Lab 12/20/13 1823  TROPONINI <0.30    BNP (last 3 results) No results found for this basename: PROBNP,  in the last 8760 hours CBG: No results found for this basename: GLUCAP,  in the last 168 hours  Radiological Exams on Admission: No results found.     Time spent: spent >70 min in direct pt care and coordination   Zelma Snead J, MD [facility] [email or web address] Password [email or web address] 12/20/2013, 8:26 PM

## 2013-12-21 DIAGNOSIS — T50902A Poisoning by unspecified drugs, medicaments and biological substances, intentional self-harm, initial encounter: Secondary | ICD-10-CM

## 2013-12-21 DIAGNOSIS — F4321 Adjustment disorder with depressed mood: Secondary | ICD-10-CM

## 2013-12-21 DIAGNOSIS — T398X2A Poisoning by other nonopioid analgesics and antipyretics, not elsewhere classified, intentional self-harm, initial encounter: Secondary | ICD-10-CM

## 2013-12-21 DIAGNOSIS — F4320 Adjustment disorder, unspecified: Secondary | ICD-10-CM | POA: Diagnosis present

## 2013-12-21 DIAGNOSIS — R9431 Abnormal electrocardiogram [ECG] [EKG]: Secondary | ICD-10-CM

## 2013-12-21 DIAGNOSIS — T50901A Poisoning by unspecified drugs, medicaments and biological substances, accidental (unintentional), initial encounter: Secondary | ICD-10-CM

## 2013-12-21 DIAGNOSIS — T391X1A Poisoning by 4-Aminophenol derivatives, accidental (unintentional), initial encounter: Secondary | ICD-10-CM | POA: Diagnosis present

## 2013-12-21 DIAGNOSIS — F4325 Adjustment disorder with mixed disturbance of emotions and conduct: Secondary | ICD-10-CM

## 2013-12-21 DIAGNOSIS — T394X2A Poisoning by antirheumatics, not elsewhere classified, intentional self-harm, initial encounter: Secondary | ICD-10-CM

## 2013-12-21 DIAGNOSIS — F4323 Adjustment disorder with mixed anxiety and depressed mood: Secondary | ICD-10-CM | POA: Diagnosis present

## 2013-12-21 LAB — COMPREHENSIVE METABOLIC PANEL
ALBUMIN: 3.3 g/dL — AB (ref 3.5–5.2)
ALBUMIN: 3.9 g/dL (ref 3.5–5.2)
ALK PHOS: 49 U/L (ref 39–117)
ALK PHOS: 46 U/L (ref 39–117)
ALK PHOS: 55 U/L (ref 39–117)
ALT: 15 U/L (ref 0–53)
ALT: 15 U/L (ref 0–53)
ALT: 18 U/L (ref 0–53)
ANION GAP: 14 (ref 5–15)
AST: 16 U/L (ref 0–37)
AST: 14 U/L (ref 0–37)
AST: 16 U/L (ref 0–37)
Albumin: 3.6 g/dL (ref 3.5–5.2)
Anion gap: 12 (ref 5–15)
Anion gap: 12 (ref 5–15)
BILIRUBIN TOTAL: 0.5 mg/dL (ref 0.3–1.2)
BILIRUBIN TOTAL: 0.6 mg/dL (ref 0.3–1.2)
BILIRUBIN TOTAL: 0.5 mg/dL (ref 0.3–1.2)
BUN: 6 mg/dL (ref 6–23)
BUN: 4 mg/dL — AB (ref 6–23)
BUN: 7 mg/dL (ref 6–23)
CHLORIDE: 107 meq/L (ref 96–112)
CHLORIDE: 105 meq/L (ref 96–112)
CHLORIDE: 101 meq/L (ref 96–112)
CO2: 22 meq/L (ref 19–32)
CO2: 24 mEq/L (ref 19–32)
CO2: 24 mEq/L (ref 19–32)
Calcium: 7.7 mg/dL — ABNORMAL LOW (ref 8.4–10.5)
Calcium: 8.4 mg/dL (ref 8.4–10.5)
Calcium: 8.4 mg/dL (ref 8.4–10.5)
Creatinine, Ser: 0.78 mg/dL (ref 0.50–1.35)
Creatinine, Ser: 0.82 mg/dL (ref 0.50–1.35)
Creatinine, Ser: 0.82 mg/dL (ref 0.50–1.35)
GFR calc Af Amer: >90 mL/min (ref >90)
GFR calc Af Amer: >90 mL/min (ref >90)
GFR calc Af Amer: >90 mL/min (ref >90)
GFR calc non Af Amer: >90 mL/min (ref >90)
GFR calc non Af Amer: >90 mL/min (ref >90)
Glucose, Bld: 110 mg/dL — ABNORMAL HIGH (ref 70–99)
Glucose, Bld: 134 mg/dL — ABNORMAL HIGH (ref 70–99)
Glucose, Bld: 141 mg/dL — ABNORMAL HIGH (ref 70–99)
POTASSIUM: 3.8 meq/L (ref 3.7–5.3)
POTASSIUM: 3.6 meq/L — AB (ref 3.7–5.3)
POTASSIUM: 4.4 meq/L (ref 3.7–5.3)
SODIUM: 141 meq/L (ref 137–147)
Sodium: 139 mEq/L (ref 137–147)
Sodium: 141 mEq/L (ref 137–147)
TOTAL PROTEIN: 6.3 g/dL (ref 6.0–8.3)
TOTAL PROTEIN: 7.3 g/dL (ref 6.0–8.3)
Total Protein: 6.8 g/dL (ref 6.0–8.3)

## 2013-12-21 LAB — CBC
HEMATOCRIT: 42.6 % (ref 39.0–52.0)
Hemoglobin: 15 g/dL (ref 13.0–17.0)
MCH: 32.9 pg (ref 26.0–34.0)
MCHC: 35.2 g/dL (ref 30.0–36.0)
MCV: 93.4 fL (ref 78.0–100.0)
PLATELETS: 175 10*3/uL (ref 150–400)
RBC: 4.56 MIL/uL (ref 4.22–5.81)
RDW: 12.5 % (ref 11.5–15.5)
WBC: 8.1 10*3/uL (ref 4.0–10.5)

## 2013-12-21 LAB — PROTIME-INR
INR: 1.18 (ref 0.00–1.49)
INR: 1.13 (ref 0.00–1.49)
PROTHROMBIN TIME: 15 s (ref 11.6–15.2)
Prothrombin Time: 14.5 seconds (ref 11.6–15.2)

## 2013-12-21 LAB — MRSA PCR SCREENING: MRSA by PCR: NEGATIVE

## 2013-12-21 LAB — ACETAMINOPHEN LEVEL
ACETAMINOPHEN (TYLENOL), SERUM: 25.8 ug/mL (ref 10–30)
Acetaminophen (Tylenol), Serum: <15 ug/mL (ref 10–30)

## 2013-12-21 MED ORDER — POTASSIUM CHLORIDE CRYS ER 20 MEQ PO TBCR
40.0000 meq | EXTENDED_RELEASE_TABLET | Freq: Once | ORAL | Status: AC
  Administered 2013-12-21: 40 meq via ORAL
  Filled 2013-12-21: qty 2

## 2013-12-21 MED ORDER — POTASSIUM CHLORIDE 2 MEQ/ML IV SOLN
INTRAVENOUS | Status: DC
Start: 2013-12-21 — End: 2013-12-21

## 2013-12-21 MED ORDER — POTASSIUM CHLORIDE IN NACL 20-0.9 MEQ/L-% IV SOLN
INTRAVENOUS | Status: DC
  Administered 2013-12-21: 12:00:00 via INTRAVENOUS

## 2013-12-21 NOTE — Progress Notes (Signed)
Pt discharged to home with his mother. Poison control and MD notified of lab values before D/C. Security brought valuables from vault. Other belongings from the floor were returned to the patient. Pharmacy had some OTC medication that the [facility] was unable to return to the patient as pharmacy was locked, but patient was informed to return during business hours and they would be returned to him.

## 2013-12-21 NOTE — Progress Notes (Signed)
[facility] PROGRESS NOTE  VERDELL DYKMAN ZOX:[id number] DOB: [birth year] DOA: 12/20/2013 PCP: No PCP Per Patient  Assessment/Plan: 1. Intentional overdose. Patient presents to the hospital with overdose of Tylenol PM as well as hydrocodone. Tylenol level was found to be markedly elevated at 189, but has trended down to 25 of treatment. He is currently on N-acetylcysteine infusion, IV fluids. Liver enzymes have been in normal range. INR is also normal range. Lab values were discussed with placement control and recommendations were for repeat Tylenol level/LFTs to be drawn at 6 PM tonight. If these levels are in normal range, then N-acetylcysteine can likely be discontinued 2. History of depression. Patient reports that he is still depressed. He has not sought any specific treatment for this and has not seen any providers. He denies any suicidal ideations are present. He is not forthcoming about any details regarding his admission. Psychiatry consult has been requested. I anticipate he should be medically cleared later this evening. 3. Prolonged QT interval on EKG. Likely related to anticholinergics. Hemodynamics have improved. We'll repeat EKG this morning. 4. Hypokalemia. Replace  Code Status: full code Family Communication: discussed with patient Disposition Plan: discharge home once improved   Consultants:  Poison control  Psychiatry consult pending  Procedures:    Antibiotics:    HPI/Subjective: Patient denies any complaints. Does not remember coming to the hospital. Is very upset that he was brought to the hospital and says that sheriffs that brought him "violated his civil rights to be left alone".   Objective: Filed Vitals:   12/21/13 0756  BP:   Pulse:   Temp: 97.9 F (36.6 C)  Resp:     Intake/Output Summary (Last 24 hours) at 12/21/13 1016 Last data filed at 12/21/13 0900  Gross per 24 hour  Intake 2687.01 ml  Output   1375 ml  Net 1312.01 ml   Filed  Weights   12/20/13 2130 12/20/13 2133 12/21/13 0500  Weight: 77.1 kg (169 lb 15.6 oz) 77.1 kg (169 lb 15.6 oz) 77.2 kg (170 lb 3.1 oz)    Exam:   General:  NAD  Cardiovascular: s1, s2, rrr  Respiratory: cta b  Abdomen: soft, nt, nd, bs+  Musculoskeletal: no edema b/l   Data Reviewed: Basic Metabolic Panel:  Recent Labs Lab 12/20/13 1823 12/21/13 0002 12/21/13 0544  NA 139 139 141  K 3.9 4.4 3.6*  CL 100 101 105  CO2 GLUCOSE 189* 134* 110*  BUN CREATININE 0.96 0.82 0.82  CALCIUM 9.1 8.4 7.7*  MG 1.8  --   --    Liver Function Tests:  Recent Labs Lab 12/20/13 1823 12/21/13 0002 12/21/13 0544  AST ALT ALKPHOS 68 55 46  BILITOT 0.4 0.5 0.6  PROT 8.1 7.3 6.3  ALBUMIN 4.6 3.9 3.3*   No results found for this basename: LIPASE, AMYLASE,  in the last 168 hours No results found for this basename: AMMONIA,  in the last 168 hours CBC:  Recent Labs Lab 12/20/13 1823 12/21/13 0544  WBC 15.9* 8.1  NEUTROABS 14.7*  --   HGB 17.0 15.0  HCT 47.2 42.6  MCV 92.7 93.4  PLT 206 175   Cardiac Enzymes:  Recent Labs Lab 12/20/13 1823  TROPONINI <0.30   BNP (last 3 results) No results found for this basename: PROBNP,  in the last 8760 hours CBG: No results found for this basename: GLUCAP,  in the  last 168 hours  Recent Results (from the past 240 hour(s))  MRSA PCR SCREENING     Status: None   Collection Time    12/20/13 11:00 PM      Result Value Ref Range Status   MRSA by PCR NEGATIVE  NEGATIVE Final   Comment:            The GeneXpert MRSA Assay (FDA     approved for NASAL specimens     only), is one component of a     comprehensive MRSA colonization     surveillance program. It is not     intended to diagnose MRSA     infection nor to guide or     monitor treatment for     MRSA infections.     Studies: No results found.  Scheduled Meds: . sodium chloride   Intravenous STAT  . heparin  5,000 Units  Subcutaneous 3 times per day  . ondansetron (ZOFRAN) IV  4 mg Intravenous Once  . potassium chloride  40 mEq Oral Once  . sodium chloride  3 mL Intravenous Q12H   Continuous Infusions: . sodium chloride 150 mL/hr at 12/21/13 0600  . acetylcysteine 15 mg/kg/hr (12/21/13 0600)    Active Problems:   Intentional drug overdose   Prolonged QT interval   Poisoning by anticholinergic drug    Time spent:    MEMON,JEHANZEB  [facility] Pager [phone]. If 7PM-7AM, please contact night-coverage at [email or web address], password [facility] 12/21/2013, 10:16 AM  LOS: 1 day

## 2013-12-21 NOTE — Clinical Social Work Note (Signed)
Per MD request, follow up appointment made for patient at hospital discharge clinic at [facility] for Tuesday September 15.  Patient to arrive between 8:30 and 11 AM.  [place], LCSW Clinical Social Worker [phone])

## 2013-12-21 NOTE — Progress Notes (Signed)
Patient ID: Derek Hester, male   DOB: 04-14-1980, 33 y.o.   MRN: [id number] Telepsych done.Pt remorseful over reaction to loss of GF and daughter yesterday.Aware of seriousness of actions and unwilling to repeat them.Wants to go to Mental health for FU/to talk to someone.He will sign contract for safety. Formal note to follow

## 2013-12-21 NOTE — Clinical Social Work Psychosocial (Signed)
Clinical Social Work Department BRIEF PSYCHOSOCIAL ASSESSMENT 12/21/2013  Patient:  Derek Hester, Derek Hester     Account Number:  [id number]     Admit date:  12/20/2013  Clinical Social Worker:  Edwyna Shell, [place]  Date/Time:  12/21/2013 05:00 PM  Referred by:  CSW  Date Referred:  12/21/2013 Referred for  Other - See comment   Other Referral:   Behavioral health   Interview type:  Patient Other interview type:    PSYCHOSOCIAL DATA Living Status:  WIFE Admitted from facility:   Level of care:   Primary support name:  Veryl Winemiller Primary support relationship to patient:  SPOUSE Degree of support available:   Estranged from spouse at present, unclear support within family.  Says he has friends who can assist him.    CURRENT CONCERNS Current Concerns  Behavioral Health Issues   Other Concerns:    SOCIAL WORK ASSESSMENT / PLAN CSW met w patient, patient alert and oriented x4, irritable, wants to be released "so I can get out of this place."  Was admitted yesterday after intentional overdose - says "I don't know why I did it, I just wanted the pain to stop."  Says "Ive got to do something - I need help, I want to talk to someone bad."  Says he is not sure where he will stay - feels he cannot return home w wife.  "If I had my cell phone, I could make something happen."  Says "I will have somewhere to stay tonight", will reach out to friends if he cannot return home.  Says  he has had "a lot of time to think about things" and realized "I did a stupid thing."  Says he has no job or insurance at present.    CSW gave referral for hospital discharge appointment on 12/25/13 8:30 - 11 at [facility].  Explained process of initial assessment.  Patient agreed he will go, wants to be linked with therapist.   Assessment/plan status:  Referral to [employer] Other assessment/ plan:   Information/referral to community resources:   Tamela Gammon     PATIENT'S/FAMILY'S RESPONSE TO PLAN OF CARE: Patient wants "help", wants "someone to talk to", "I have to get my life back on track."        Edwyna Shell, [place] Worker [phone])

## 2013-12-21 NOTE — Progress Notes (Signed)
MEDICATION RELATED CONSULT NOTE - follow up  Pharmacy Consult for IV Acetylcysteine Indication: Tylenol OD  Allergies  Allergen Reactions  . Penicillins    Patient Measurements: Height:  (172.7 cm) Weight: 170 lb 3.1 oz (77.2 kg) IBW/kg (Calculated) : 68.4  Vital Signs: Temp: 97.9 F (36.6 C) (09/11 0756) Temp src: Oral (09/11 0756) BP: 130/94 mmHg (09/11 0600) Pulse Rate: 62 (09/11 0600) Intake/Output from previous day: 09/10 0701 - 09/11 0700 In: 2687 [I.V.:2687] Out: 900 [Urine:900] Intake/Output from this shift: Total I/O In: -  Out: 475 [Urine:475]  Labs:  Recent Labs  12/20/13 1823 12/21/13 0002 12/21/13 0544  WBC 15.9*  --  8.1  HGB 17.0  --  15.0  HCT 47.2  --  42.6  PLT 206  --  175  CREATININE 0.96 0.82 0.82  MG 1.8  --   --   ALBUMIN 4.6 3.9 3.3*  PROT 8.1 7.3 6.3  AST ALT ALKPHOS 68 55 46  BILITOT 0.4 0.5 0.6   Estimated Creatinine Clearance: 126.3 ml/min (by C-G formula based on Cr of 0.82).  Acetaminophen (Tylenol), Serum Acetaminophen level       Collected: 12/20/13 1823   Resulting lab: [facility]   Reference range: 10 - 30 ug/mL   Value: 189.9 (High Panic)   Acetaminophen (Tylenol), Serum 10 - 30 ug/mL  25.8    Medical History: Past Medical History  Diagnosis Date  . Renal calculi 09/22/2012  . Depression    Medications:  Scheduled:  . sodium chloride   Intravenous STAT  . heparin  5,000 Units Subcutaneous 3 times per day  . ondansetron (ZOFRAN) IV  4 mg Intravenous Once  . potassium chloride  40 mEq Oral Once  . sodium chloride  3 mL Intravenous Q12H   Infusions:  . sodium chloride 150 mL/hr at 12/21/13 0600  . acetylcysteine 15 mg/kg/hr (12/21/13 0600)   Assessment: Okay for Protocol, 33 yo male presents with elevated APAP level s/p ingestion of Percocet and Tylenol > 4 hours from admission.   LFT's have not increased.  Tylenol level is lower but still detectable. Called Poison Control this  morning to discuss these results.    Goal of Therapy:  Preserve organ function.  Plan:   Continue IV Acetylcysteine per protocol.   Recheck labs at 6pm:  CMET, PT/INR and Tylenol LVL.  Call Poison Control with labs results tonight.    If tylenol level is undetectable and LFTs have not bumped up then anticipate PC will give go ahead to stop Acetadote tonight.  Margo Aye, Ertha Nabor A 12/21/2013,10:30 AM

## 2013-12-21 NOTE — Discharge Instructions (Signed)
Accidental Overdose  A drug overdose occurs when a chemical substance (drug or medication) is used in amounts large enough to overcome a person. This may result in severe illness or death. This is a type of poisoning. Accidental overdoses of medications or other substances come from a variety of reasons. When this happens accidentally, it is often because the person taking the substance does not know enough about what they have taken. Drugs which commonly cause overdose deaths are alcohol, psychotropic medications (medications which affect the mind), pain medications, illegal drugs (street drugs) such as cocaine and heroin, and multiple drugs taken at the same time. It may result from careless behavior (such as over-indulging at a party). Other causes of overdose may include multiple drug use, a lapse in memory, or drug use after a period of no drug use.   Sometimes overdosing occurs because a person cannot remember if they have taken their medication.   A common unintentional overdose in young children involves multi-vitamins containing iron. Iron is a part of the hemoglobin molecule in blood. It is used to transport oxygen to living cells. When taken in small amounts, iron allows the body to restock hemoglobin. In large amounts, it causes problems in the body. If this overdose is not treated, it can lead to death.  Never take medicines that show signs of tampering or do not seem quite right. Never take medicines in the dark or in poor lighting. Read the label and check each dose of medicine before you take it. When adults are poisoned, it happens most often through carelessness or lack of information. Taking medicines in the dark or taking medicine prescribed for someone else to treat the same type of problem is a dangerous practice.  SYMPTOMS   Symptoms of overdose depend on the medication and amount taken. They can vary from over-activity with stimulant over-dosage, to sleepiness from depressants such as  alcohol, narcotics and tranquilizers. Confusion, dizziness, nausea and vomiting may be present. If problems are severe enough coma and death may result.  DIAGNOSIS   Diagnosis and management are generally straightforward if the drug is known. Otherwise it is more difficult. At times, certain symptoms and signs exhibited by the patient, or blood tests, can reveal the drug in question.   TREATMENT   In an emergency department, most patients can be treated with supportive measures. Antidotes may be available if there has been an overdose of opioids or benzodiazepines. A rapid improvement will often occur if this is the cause of overdose.  At home or away from medical care:   There may be no immediate problems or warning signs in children.   Not everything works well in all cases of poisoning.   Take immediate action. Poisons may act quickly.   If you think someone has swallowed medicine or a household product, and the person is unconscious, having seizures (convulsions), or is not breathing, immediately call for an ambulance.  IF a person is conscious and appears to be doing OK but has swallowed a poison:   Do not wait to see what effect the poison will have. Immediately call a poison control center (listed in the white pages of your telephone book under "Poison Control" or inside the front cover with other emergency numbers). Some poison control centers have TTY capability for the deaf. Check with your local center if you or someone in your family requires this service.   Keep the container so you can read the label on the product for ingredients.     Describe what, when, and how much was taken and the age and condition of the person poisoned. Inform them if the person is vomiting, choking, drowsy, shows a change in color or temperature of skin, is conscious or unconscious, or is convulsing.   Do not cause vomiting unless instructed by medical personnel. Do not induce vomiting or force liquids into a person who  is convulsing, unconscious, or very drowsy.  Stay calm and in control.    Activated charcoal also is sometimes used in certain types of poisoning and you may wish to add a supply to your emergency medicines. It is available without a prescription. Call a poison control center before using this medication.  PREVENTION   Thousands of children die every year from unintentional poisoning. This may be from household chemicals, poisoning from carbon monoxide in a car, taking their parent's medications, or simply taking a few iron pills or vitamins with iron. Poisoning comes from unexpected sources.   Store medicines out of the sight and reach of children, preferably in a locked cabinet. Do not keep medications in a food cabinet. Always store your medicines in a secure place. Get rid of expired medications.   If you have children living with you or have them as occasional guests, you should have child-resistant caps on your medicine containers. Keep everything out of reach. Child proof your home.   If you are called to the telephone or to answer the door while you are taking a medicine, take the container with you or put the medicine out of the reach of small children.   Do not take your medication in front of children. Do not tell your child how good a medication is and how good it is for them. They may get the idea it is more of a treat.   If you are an adult and have accidentally taken an overdose, you need to consider how this happened and what can be done to prevent it from happening again. If this was from a street drug or alcohol, determine if there is a problem that needs addressing. If you are not sure a problems exists, it is easy to talk to a professional and ask them if they think you have a problem. It is better to handle this problem in this way before it happens again and has a much worse consequence.  Document Released: 06/12/2004 Document Revised: 06/21/2011 Document Reviewed: 11/18/2008  ExitCare  Patient Information 2015 [employer]. This information is not intended to replace advice given to you by your health care provider. Make sure you discuss any questions you have with your health care provider.

## 2013-12-21 NOTE — Discharge Summary (Signed)
Physician Discharge Summary  Derek YAWORSKI ZOX:[id number] DOB: 23-Sep-1980 DOA: 12/20/2013  PCP: No PCP Per Patient  Admit date: 12/20/2013 Discharge date: 12/21/2013  Time spent: 40 minutes  Recommendations for Outpatient Follow-up:  1. Patient has been set up to for outpatient psychiatry  Discharge Diagnoses:  Active Problems:   Intentional drug overdose   Prolonged QT interval   Poisoning by anticholinergic drug   Adjustment disorder with mixed anxiety and depressed mood   Abnormal grief reaction   Overdose by acetaminophen   Discharge Condition: improved  Diet recommendation: regular diet  Filed Weights   12/20/13 2130 12/20/13 2133 12/21/13 0500  Weight: 77.1 kg (169 lb 15.6 oz) 77.1 kg (169 lb 15.6 oz) 77.2 kg (170 lb 3.1 oz)    History of present illness and hospital course:  This patient was admitted to the hospital with intentional overdose of Tylenol PM as well as hydrocodone. He described recent stressors in his life such as his girlfriend leaving and taking his daughter. When he was brought to the emergency room patient also mental status. Tylenol is markedly elevated. He was started on intravenous fluids as well as N-acetylcysteine. He was followed by placing control. Liver enzymes and coagulation parameters remained normal. His Tylenol level decreased to undetectable levels. He was cleared by placing control to come off N-acetylcysteine. He did have some QT prolongation which is felt to be related to anticholinergic agents. This resolved with treatment. He was seen by psychiatry who felt that he was appropriate for outpatient psychiatry followup. The patient was discharged home in stable condition.  Procedures:    Consultations:  Poison control  Psychiatry  Discharge Exam: Filed Vitals:   12/21/13 1700  BP: 129/81  Pulse: 77  Temp:   Resp: 13    General: NAD Cardiovascular: S1, s2 RRR Respiratory: CTA B  Discharge Instructions You were cared for by  a hospitalist during your hospital stay. If you have any questions about your discharge medications or the care you received while you were in the hospital after you are discharged, you can call the unit and asked to speak with the hospitalist on call if the hospitalist that took care of you is not available. Once you are discharged, your primary care physician will handle any further medical issues. Please note that NO REFILLS for any discharge medications will be authorized once you are discharged, as it is imperative that you return to your primary care physician (or establish a relationship with a primary care physician if you do not have one) for your aftercare needs so that they can reassess your need for medications and monitor your lab values.  Discharge Instructions   Call MD for:  persistant dizziness or light-headedness    Complete by:  As directed      Call MD for:  persistant nausea and vomiting    Complete by:  As directed      Diet general    Complete by:  As directed      Increase activity slowly    Complete by:  As directed           Discharge Medication List as of 12/21/2013  6:45 PM    CONTINUE these medications which have NOT CHANGED   Details  L-LYSINE PO Take 1 capsule by mouth daily as needed (for cold sores)., Until Discontinued, Historical Med      STOP taking these medications     diphenhydramine-acetaminophen (TYLENOL PM) 25-500 MG TABS  Allergies  Allergen Reactions  . Penicillins    Follow-up Information   Follow up with [facility] On 12/25/2013. (Arrive between 8:30am and 11:00am)        The results of significant diagnostics from this hospitalization (including imaging, microbiology, ancillary and laboratory) are listed below for reference.    Significant Diagnostic Studies: No results found.  Microbiology: Recent Results (from the past 240 hour(s))  MRSA PCR SCREENING     Status: None   Collection Time    12/20/13 11:00  PM      Result Value Ref Range Status   MRSA by PCR NEGATIVE  NEGATIVE Final   Comment:            The GeneXpert MRSA Assay (FDA     approved for NASAL specimens     only), is one component of a     comprehensive MRSA colonization     surveillance program. It is not     intended to diagnose MRSA     infection nor to guide or     monitor treatment for     MRSA infections.     Labs: Basic Metabolic Panel:  Recent Labs Lab 12/20/13 1823 12/21/13 0002 12/21/13 0544 12/21/13 1734  NA 139 139 141 141  K 3.9 4.4 3.6* 3.8  CL 100 101 105 107  CO2 GLUCOSE 189* 134* 110* 141*  BUN 4*  CREATININE 0.96 0.82 0.82 0.78  CALCIUM 9.1 8.4 7.7* 8.4  MG 1.8  --   --   --    Liver Function Tests:  Recent Labs Lab 12/20/13 1823 12/21/13 0002 12/21/13 0544 12/21/13 1734  AST ALT ALKPHOS 68 55 46 49  BILITOT 0.4 0.5 0.6 0.5  PROT 8.1 7.3 6.3 6.8  ALBUMIN 4.6 3.9 3.3* 3.6   No results found for this basename: LIPASE, AMYLASE,  in the last 168 hours No results found for this basename: AMMONIA,  in the last 168 hours CBC:  Recent Labs Lab 12/20/13 1823 12/21/13 0544  WBC 15.9* 8.1  NEUTROABS 14.7*  --   HGB 17.0 15.0  HCT 47.2 42.6  MCV 92.7 93.4  PLT 206 175   Cardiac Enzymes:  Recent Labs Lab 12/20/13 1823  TROPONINI <0.30   BNP: BNP (last 3 results) No results found for this basename: PROBNP,  in the last 8760 hours CBG: No results found for this basename: GLUCAP,  in the last 168 hours     Signed:  Kristiane Morsch  [facility] 12/21/2013, 7:32 PM

## 2013-12-21 NOTE — Consult Note (Signed)
Telepsych Consultation   Reason for Consult:  MD referral Referring Physician:  Dr Moss Mc Derek Hester is an 33 y.o. male.seen in [facility] by telepsych at request of Dr [facility] s/p overdose on tylenol 9/10. The pt states repeatedly "I hit rock bottom" referring to his girlfriend leaving him yesterday and taking his 40 mo old daughter with him.he also describes the incident as the straw taht broke the camel's back. In a moment of such despondency he admits he acted impulsively he says ever since last nite he has had time to reflect on what he did and deeply regrets his attempt to overdose.He now realizes "that (Taking pills) is no solution" and he is willing to contract for safety and wants to "talk to someone at [facility] mental health" .  Assessment: AXIS I:  Adjustment Disorder with Mixed Disturbance of Emotions and Conduct;Grief reaction;Depression AXIS II:  Deferred AXIS III:   Past Medical History  Diagnosis Date  . Renal calculi 09/22/2012  . Depression    AXIS IV:  problems with primary support group AXIS V:  41-50 serious symptoms  Plan:  No evidence of imminent risk to self or others at present.   Supportive therapy provided about ongoing stressors. Discussed crisis plan, support from social network, calling 911, coming to the Emergency Department, and calling Suicide Hotline. Referrals information for FU counseling as outpatient  Subjective:   Derek Hester is a 33 y.o. male patient admitted with above history.  HPI:  See above/ED provider note/admission H&P HPI Elements:   Location:  [facility] telepsych. Severity:  Dangerous attempt but crisis has passed . Timing:  Acute incident 9/10. Duration:  Past 24 hours though relationship problem is much longer.  Past Psychiatric History: Past Medical History  Diagnosis Date  . Renal calculi 09/22/2012  . Depression     reports that he has been smoking Cigarettes.  He has been smoking about 0.00 packs  per day. He does not have any smokeless tobacco history on file. He reports that he drinks alcohol. He reports that he does not use illicit drugs. History reviewed. No pertinent family history.       Allergies:   Allergies  Allergen Reactions  . Penicillins     ACT Assessment Complete:  No:   Past Psychiatric History: Diagnosis:  Adjustment disorder with mixed disturbance of conduct and emotion  Hospitalizations:  [facility] 9/10-present 1st incident  Outpatient Care:  NA  Substance Abuse Care:  NA  Self-Mutilation:  NA  Suicidal Attempts:  12/20/2013  Homicidal Behaviors:  NA   Violent Behaviors:  None known   Place of Residence:  [facility] Marital Status:  Has GF mother of their daughter Employed/Unemployed:  Uninsured Education:  HS Family Supports:  Mother helped save his life after he called her and told her what he had done Objective: Blood pressure 130/94, pulse 62, temperature 98.3 F (36.8 C), temperature source Oral, resp. rate 21, height 5' 8"  (1.727 m), weight 77.2 kg (170 lb 3.1 oz), SpO2 98.00%.Body mass index is 25.88 kg/(m^2). Results for orders placed during the hospital encounter of 12/20/13 (from the past 72 hour(s))  ETHANOL     Status: None   Collection Time    12/20/13  6:23 PM      Result Value Ref Range   Alcohol, Ethyl (B) <11  0 - 11 mg/dL   Comment:            LOWEST DETECTABLE LIMIT FOR  SERUM ALCOHOL IS 11 mg/dL     FOR MEDICAL PURPOSES ONLY  CBC WITH DIFFERENTIAL     Status: Abnormal   Collection Time    12/20/13  6:23 PM      Result Value Ref Range   WBC 15.9 (*) 4.0 - 10.5 K/uL   RBC 5.09  4.22 - 5.81 MIL/uL   Hemoglobin 17.0  13.0 - 17.0 g/dL   HCT 47.2  39.0 - 52.0 %   MCV 92.7  78.0 - 100.0 fL   MCH 33.4  26.0 - 34.0 pg   MCHC 36.0  30.0 - 36.0 g/dL   RDW 12.3  11.5 - 15.5 %   Platelets 206  150 - 400 K/uL   Neutrophils Relative % 93 (*) 43 - 77 %   Neutro Abs 14.7 (*) 1.7 - 7.7 K/uL   Lymphocytes Relative 5 (*) 12 -  46 %   Lymphs Abs 0.7  0.7 - 4.0 K/uL   Monocytes Relative 2 (*) 3 - 12 %   Monocytes Absolute 0.4  0.1 - 1.0 K/uL   Eosinophils Relative 0  0 - 5 %   Eosinophils Absolute 0.0  0.0 - 0.7 K/uL   Basophils Relative 0  0 - 1 %   Basophils Absolute 0.0  0.0 - 0.1 K/uL  ACETAMINOPHEN LEVEL     Status: Abnormal   Collection Time    12/20/13  6:23 PM      Result Value Ref Range   Acetaminophen (Tylenol), Serum 189.9 (*) 10 - 30 ug/mL   Comment:            THERAPEUTIC CONCENTRATIONS VARY     SIGNIFICANTLY. A RANGE OF 10-30     ug/mL MAY BE AN EFFECTIVE     CONCENTRATION FOR MANY PATIENTS.     HOWEVER, SOME ARE BEST TREATED     AT CONCENTRATIONS OUTSIDE THIS     RANGE.     ACETAMINOPHEN CONCENTRATIONS     >150 ug/mL AT 4 HOURS AFTER     INGESTION AND >50 ug/mL AT 12     HOURS AFTER INGESTION ARE     OFTEN ASSOCIATED WITH TOXIC     REACTIONS.  SALICYLATE LEVEL     Status: Abnormal   Collection Time    12/20/13  6:23 PM      Result Value Ref Range   Salicylate Lvl <4.6 (*) 2.8 - 20.0 mg/dL  COMPREHENSIVE METABOLIC PANEL     Status: Abnormal   Collection Time    12/20/13  6:23 PM      Result Value Ref Range   Sodium 139  137 - 147 mEq/L   Potassium 3.9  3.7 - 5.3 mEq/L   Chloride 100  96 - 112 mEq/L   CO2 22  19 - 32 mEq/L   Glucose, Bld 189 (*) 70 - 99 mg/dL   BUN 8  6 - 23 mg/dL   Creatinine, Ser 0.96  0.50 - 1.35 mg/dL   Calcium 9.1  8.4 - 10.5 mg/dL   Total Protein 8.1  6.0 - 8.3 g/dL   Albumin 4.6  3.5 - 5.2 g/dL   AST 20  0 - 37 U/L   ALT 20  0 - 53 U/L   Alkaline Phosphatase 68  39 - 117 U/L   Total Bilirubin 0.4  0.3 - 1.2 mg/dL   GFR calc non Af Amer >90  >90 mL/min   GFR calc Af Amer >90  >90 mL/min  Comment: (NOTE)     The eGFR has been calculated using the CKD EPI equation.     This calculation has not been validated in all clinical situations.     eGFR's persistently <90 mL/min signify possible Chronic Kidney     Disease.   Anion gap 17 (*) 5 - 15   PROTIME-INR     Status: None   Collection Time    12/20/13  6:23 PM      Result Value Ref Range   Prothrombin Time 12.6  11.6 - 15.2 seconds   INR 0.94  0.00 - 1.49  TROPONIN I     Status: None   Collection Time    12/20/13  6:23 PM      Result Value Ref Range   Troponin I <0.30  <0.30 ng/mL   Comment:            Due to the release kinetics of cTnI,     a negative result within the first hours     of the onset of symptoms does not rule out     myocardial infarction with certainty.     If myocardial infarction is still suspected,     repeat the test at appropriate intervals.  MAGNESIUM     Status: None   Collection Time    12/20/13  6:23 PM      Result Value Ref Range   Magnesium 1.8  1.5 - 2.5 mg/dL  URINE RAPID DRUG SCREEN (HOSP PERFORMED)     Status: None   Collection Time    12/20/13  7:10 PM      Result Value Ref Range   Opiates NONE DETECTED  NONE DETECTED   Cocaine NONE DETECTED  NONE DETECTED   Benzodiazepines NONE DETECTED  NONE DETECTED   Amphetamines NONE DETECTED  NONE DETECTED   Tetrahydrocannabinol NONE DETECTED  NONE DETECTED   Barbiturates NONE DETECTED  NONE DETECTED   Comment:            DRUG SCREEN FOR MEDICAL PURPOSES     ONLY.  IF CONFIRMATION IS NEEDED     FOR ANY PURPOSE, NOTIFY LAB     WITHIN 5 DAYS.                LOWEST DETECTABLE LIMITS     FOR URINE DRUG SCREEN     Drug Class       Cutoff (ng/mL)     Amphetamine      1000     Barbiturate      200     Benzodiazepine   492     Tricyclics       010     Opiates          300     Cocaine          300     THC              50  MRSA PCR SCREENING     Status: None   Collection Time    12/20/13 11:00 PM      Result Value Ref Range   MRSA by PCR NEGATIVE  NEGATIVE   Comment:            The GeneXpert MRSA Assay (FDA     approved for NASAL specimens     only), is one component of a     comprehensive MRSA colonization     surveillance program. It is not  intended to diagnose MRSA      infection nor to guide or     monitor treatment for     MRSA infections.  COMPREHENSIVE METABOLIC PANEL     Status: Abnormal   Collection Time    12/21/13 12:02 AM      Result Value Ref Range   Sodium 139  137 - 147 mEq/L   Potassium 4.4  3.7 - 5.3 mEq/L   Chloride 101  96 - 112 mEq/L   CO2 24  19 - 32 mEq/L   Glucose, Bld 134 (*) 70 - 99 mg/dL   BUN 7  6 - 23 mg/dL   Creatinine, Ser 0.82  0.50 - 1.35 mg/dL   Calcium 8.4  8.4 - 10.5 mg/dL   Total Protein 7.3  6.0 - 8.3 g/dL   Albumin 3.9  3.5 - 5.2 g/dL   AST 16  0 - 37 U/L   ALT 18  0 - 53 U/L   Alkaline Phosphatase 55  39 - 117 U/L   Total Bilirubin 0.5  0.3 - 1.2 mg/dL   GFR calc non Af Amer >90  >90 mL/min   GFR calc Af Amer >90  >90 mL/min   Comment: (NOTE)     The eGFR has been calculated using the CKD EPI equation.     This calculation has not been validated in all clinical situations.     eGFR's persistently <90 mL/min signify possible Chronic Kidney     Disease.   Anion gap 14  5 - 15  ACETAMINOPHEN LEVEL     Status: None   Collection Time    12/21/13  5:44 AM      Result Value Ref Range   Acetaminophen (Tylenol), Serum 25.8  10 - 30 ug/mL   Comment:            THERAPEUTIC CONCENTRATIONS VARY     SIGNIFICANTLY. A RANGE OF 10-30     ug/mL MAY BE AN EFFECTIVE     CONCENTRATION FOR MANY PATIENTS.     HOWEVER, SOME ARE BEST TREATED     AT CONCENTRATIONS OUTSIDE THIS     RANGE.     ACETAMINOPHEN CONCENTRATIONS     >150 ug/mL AT 4 HOURS AFTER     INGESTION AND >50 ug/mL AT 12     HOURS AFTER INGESTION ARE     OFTEN ASSOCIATED WITH TOXIC     REACTIONS.  PROTIME-INR     Status: None   Collection Time    12/21/13  5:44 AM      Result Value Ref Range   Prothrombin Time 15.0  11.6 - 15.2 seconds   INR 1.18  0.00 - 1.49  COMPREHENSIVE METABOLIC PANEL     Status: Abnormal   Collection Time    12/21/13  5:44 AM      Result Value Ref Range   Sodium 141  137 - 147 mEq/L   Potassium 3.6 (*) 3.7 - 5.3 mEq/L    Comment: DELTA CHECK NOTED   Chloride 105  96 - 112 mEq/L   CO2 24  19 - 32 mEq/L   Glucose, Bld 110 (*) 70 - 99 mg/dL   BUN 6  6 - 23 mg/dL   Creatinine, Ser 0.82  0.50 - 1.35 mg/dL   Calcium 7.7 (*) 8.4 - 10.5 mg/dL   Total Protein 6.3  6.0 - 8.3 g/dL   Albumin 3.3 (*) 3.5 - 5.2 g/dL   AST 14  0 -  37 U/L   ALT 15  0 - 53 U/L   Alkaline Phosphatase 46  39 - 117 U/L   Total Bilirubin 0.6  0.3 - 1.2 mg/dL   GFR calc non Af Amer >90  >90 mL/min   GFR calc Af Amer >90  >90 mL/min   Comment: (NOTE)     The eGFR has been calculated using the CKD EPI equation.     This calculation has not been validated in all clinical situations.     eGFR's persistently <90 mL/min signify possible Chronic Kidney     Disease.   Anion gap 12  5 - 15  CBC     Status: None   Collection Time    12/21/13  5:44 AM      Result Value Ref Range   WBC 8.1  4.0 - 10.5 K/uL   RBC 4.56  4.22 - 5.81 MIL/uL   Hemoglobin 15.0  13.0 - 17.0 g/dL   HCT 42.6  39.0 - 52.0 %   MCV 93.4  78.0 - 100.0 fL   MCH 32.9  26.0 - 34.0 pg   MCHC 35.2  30.0 - 36.0 g/dL   RDW 12.5  11.5 - 15.5 %   Platelets 175  150 - 400 K/uL   Labs are reviewed and are pertinent for dangerous acetominophen level post ingestion-UDS was negative including FOR OPIATES.  Current Facility-Administered Medications  Medication Dose Route Frequency Provider Last Rate Last Dose  . 0.9 % NaCl with KCl 20 mEq/ L  infusion   Intravenous Continuous Kathie Dike, MD 100 mL/hr at 12/21/13 1150    . acetylcysteine (ACETADOTE) 40,000 mg in dextrose 5 % 1,000 mL (40 mg/mL) infusion  15 mg/kg/hr Intravenous Continuous Francine Graven, DO 29.2 mL/hr at 12/21/13 0600 15 mg/kg/hr at 12/21/13 0600  . heparin injection 5,000 Units  5,000 Units Subcutaneous 3 times per day Waldemar Dickens, MD      . naloxone [facility]) injection 0.4 mg  0.4 mg Intravenous PRN Waldemar Dickens, MD      . ondansetron [facility]) injection 4 mg  4 mg Intravenous Once Waldemar Dickens, MD       . sodium chloride 0.9 % injection 3 mL  3 mL Intravenous Q12H Waldemar Dickens, MD        Psychiatric Specialty Exam:     Blood pressure 130/94, pulse 62, temperature 98.3 F (36.8 C), temperature source Oral, resp. rate 21, height 5' 8"  (1.727 m), weight 77.2 kg (170 lb 3.1 oz), SpO2 98.00%.Body mass index is 25.88 kg/(m^2).  General Appearance: Fairly Groomed  Engineer, water::  Good  Speech:  Clear and Coherent  Volume:  Normal  Mood:  Dysphoric  Affect:  Congruent and Full Range  Thought Process:  Coherent and Logical  Orientation:  Full (Time, Place, and Person)  Thought Content:  Negative  Suicidal Thoughts:  No  Homicidal Thoughts:  No  Memory:  Negative  Judgement:  Intact  Insight:  Present  Psychomotor Activity:  Normal  Concentration:  Good  Recall:  Good  Akathisia:  NA  Handed:  Right  AIMS (if indicated):  NA  Assets:  Desire for Improvement Social Support  Sleep:  NA   Treatment Plan Summary: Recommend contract for safety/D/C home with resources for FU OP care  Disposition:As above Home with resources for outpatient FU  Dara Hoyer 12/21/2013 1:43 PM

## 2013-12-24 NOTE — Consult Note (Signed)
Case discussed, agree with plan 

## 2014-01-04 NOTE — Progress Notes (Signed)
UR chart review completed.  

## 2014-03-12 DEATH — deceased

## 2014-08-03 IMAGING — CT CT ABD-PELV W/ CM
2 of 3 series · 16 of 46 positions shown, 18 images · IV contrast (Omnipaque 300)
Comparison: CT of the abdomen and pelvis September 22, 2012.

CLINICAL DATA: New onset right lower quadrant pain. History of
kidney stones.

EXAM:
CT ABDOMEN AND PELVIS WITH CONTRAST
TECHNIQUE: Multidetector CT imaging of the abdomen and pelvis was performed
using the standard protocol following bolus administration of
intravenous contrast.
CONTRAST:  50mL OMNIPAQUE IOHEXOL 300 MG/ML SOLN, 100mL OMNIPAQUE
IOHEXOL 300 MG/ML SOLN

[Series 2: abd_pel_with 5.0 b40f · axial · 0.59mm/px · z∈[-524,-94]mm · 13 of 100 slices shown, 15 images]
[im 7/100  soft-tissue]
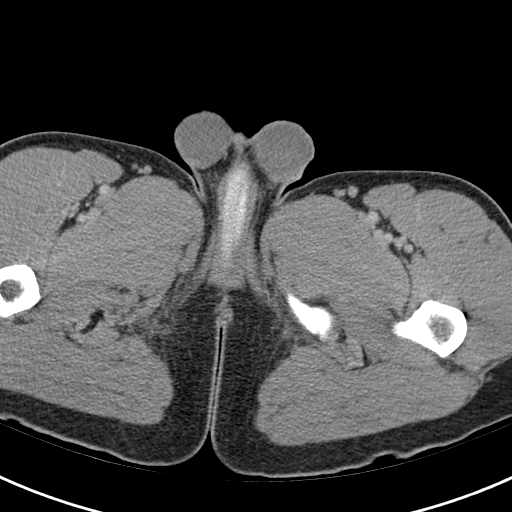
[im 7/100  bone]
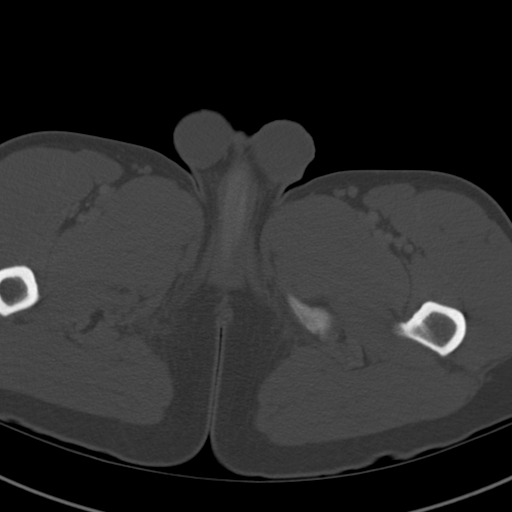
[im 13/100  soft-tissue]
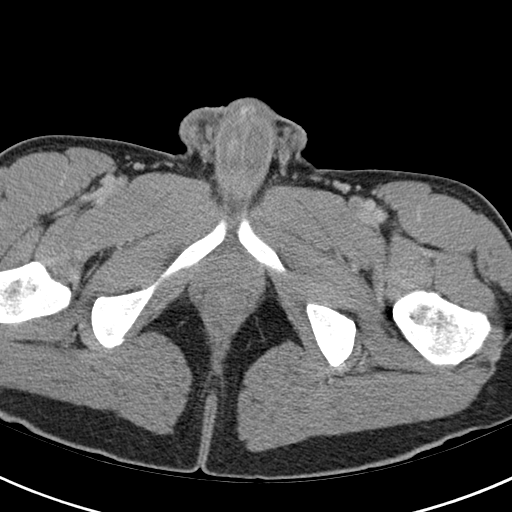
[im 20/100  soft-tissue]
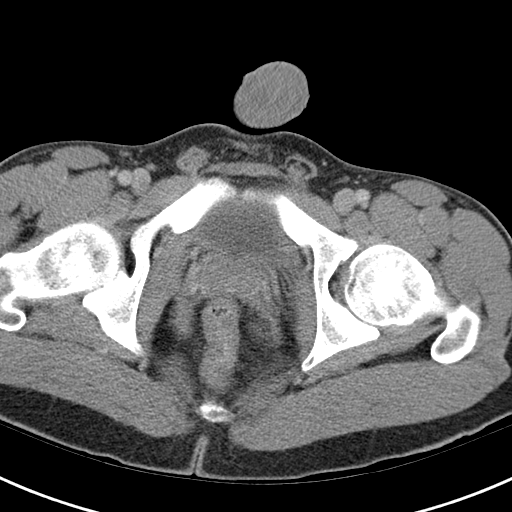
[im 29/100  soft-tissue]
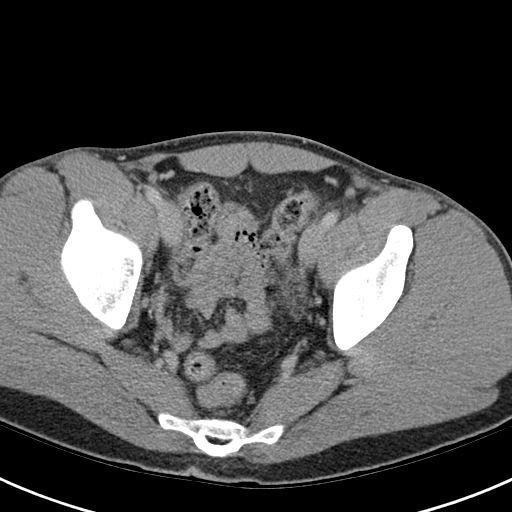
[im 36/100  soft-tissue]
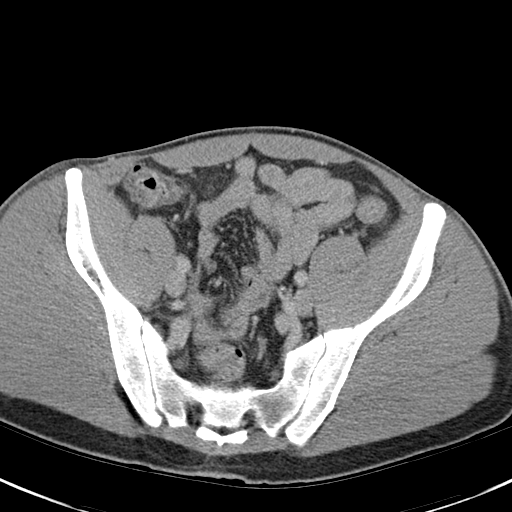
[im 42/100  soft-tissue]
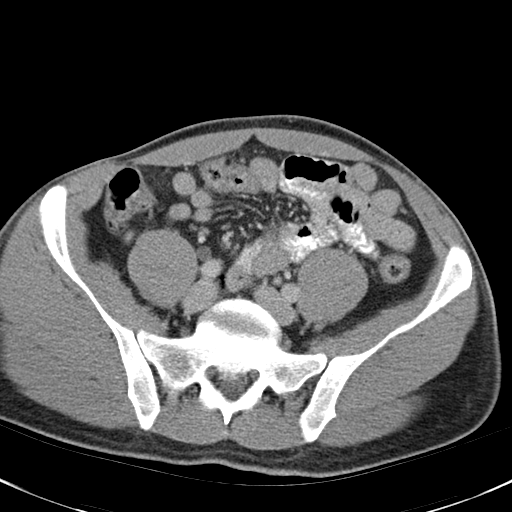
[im 52/100  soft-tissue]
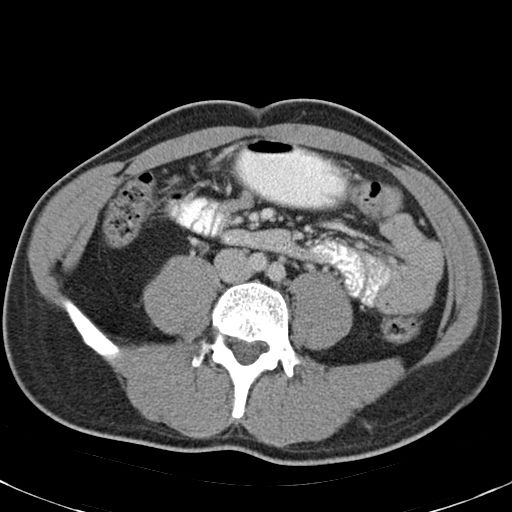
[im 58/100  soft-tissue]
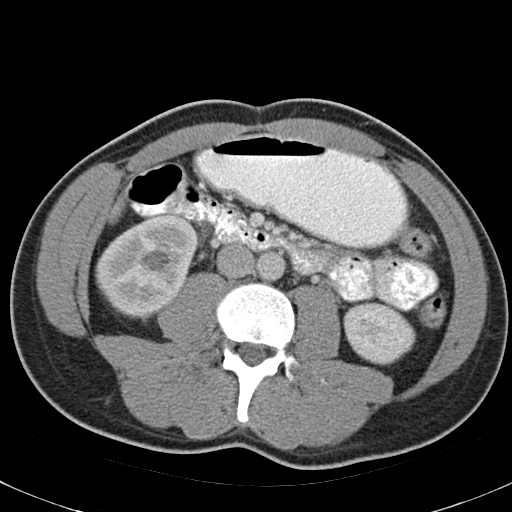
[im 64/100  soft-tissue]
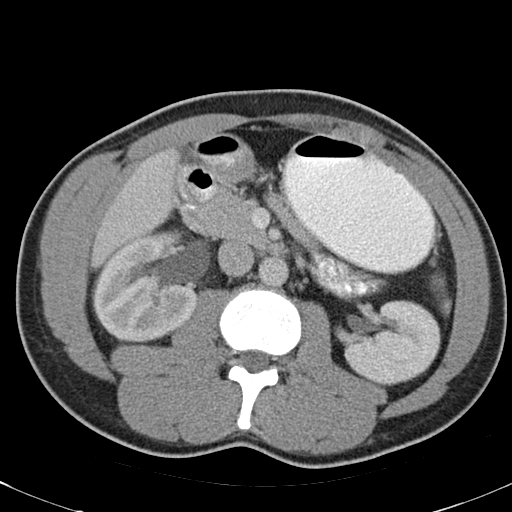
[im 64/100  bone]
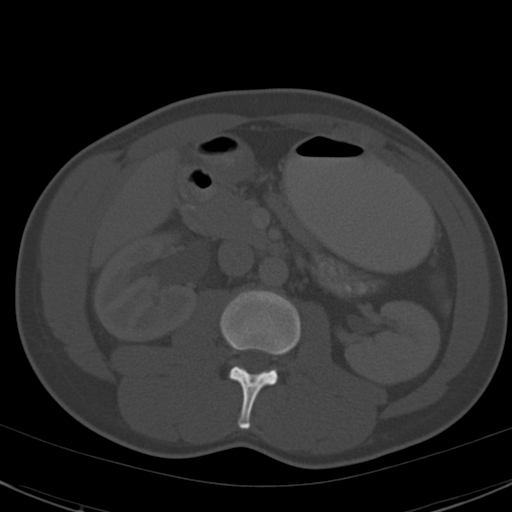
[im 71/100  soft-tissue]
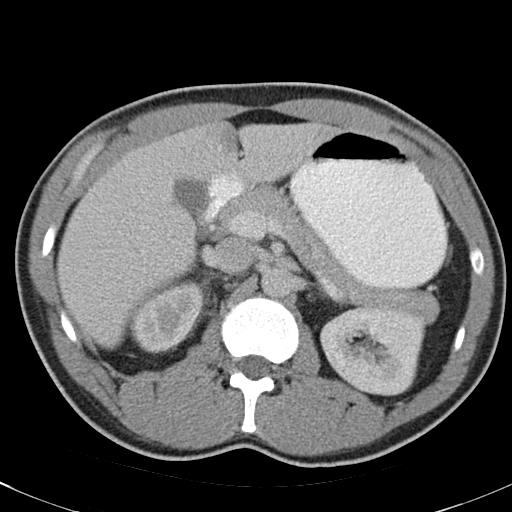
[im 80/100  soft-tissue]
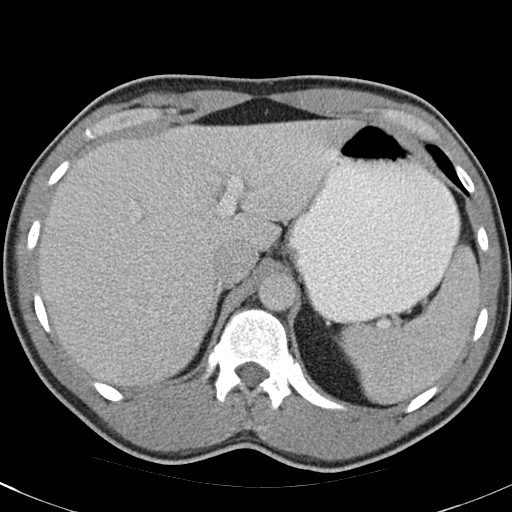
[im 87/100  soft-tissue]
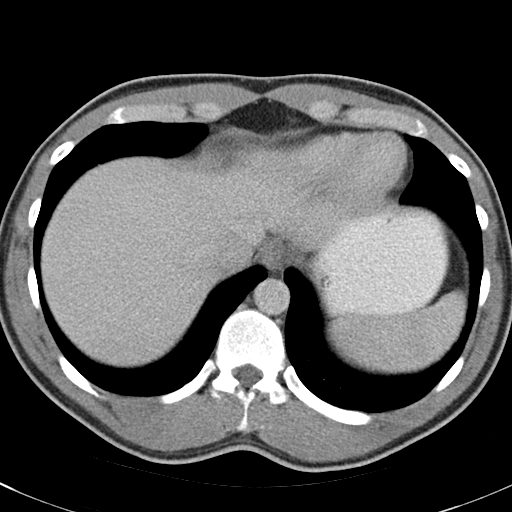
[im 93/100  soft-tissue]
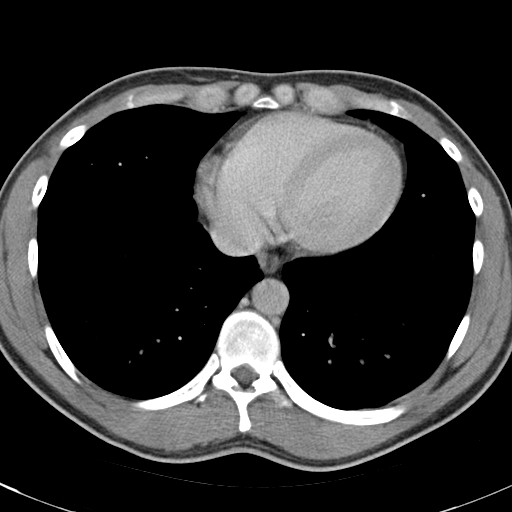

[Series 4: abd_pel_with 3.0 spo cor · coronal · 0.65mm/px · 3 of 83 slices shown]
[im 28/83  soft-tissue]
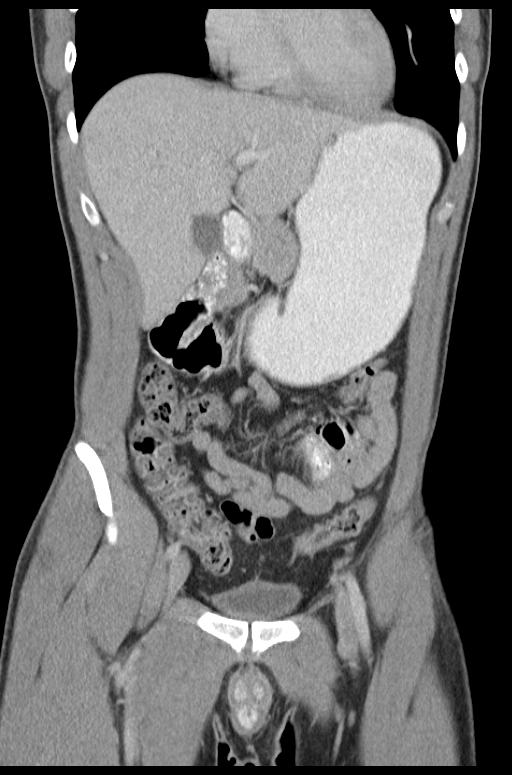
[im 37/83  soft-tissue]
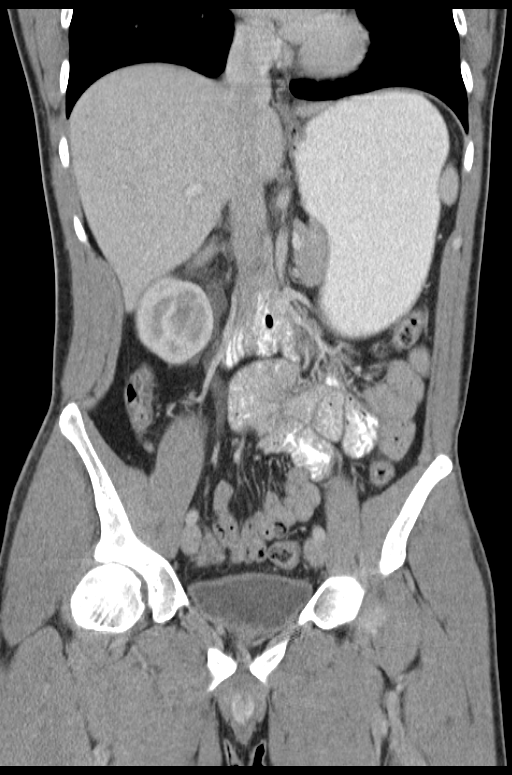
[im 46/83  soft-tissue]
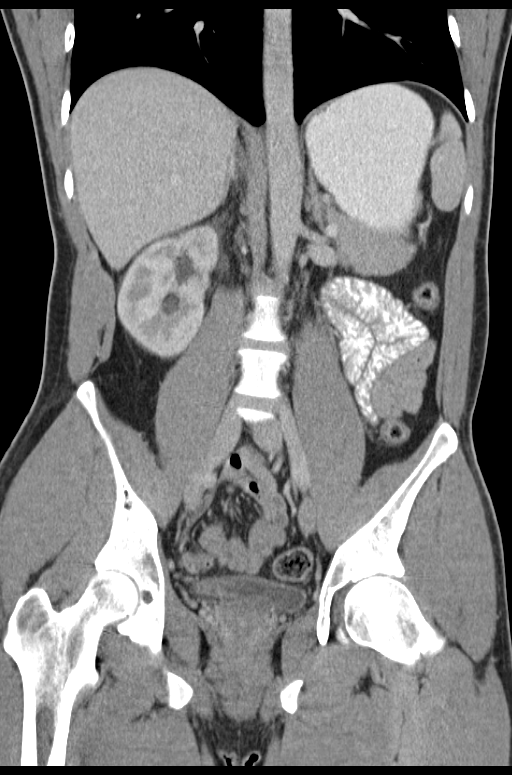

[16 of 46 positions shown; findings below may reference images not displayed]

FINDINGS: Included view of the lung bases are clear. Included heart and
pericardium are unremarkable.

The liver, spleen, gallbladder, pancreas and adrenal glands are
unremarkable.

The stomach, small and large bowel are normal in course and caliber
without inflammatory changes. Normal appendix. No intraperitoneal
free fluid nor free air.

Kidneys are orthotopic, demonstrating symmetric enhancement.
Moderate right hydroureteronephrosis to the level of the
ureterovesicular junction where a 4 mm calculus is seen. No residual
urolithiasis. No renal masses. The unopacified ureters are normal in
course and caliber.

Great vessels are normal in course and caliber. No lymphadenopathy
by CT size criteria. Internal reproductive organs are unremarkable.
soft tissues and included osseous structures are nonsuspicious. Mild
broad levoscoliosis may be positional.
IMPRESSION: Moderate right hydroureteronephrosis to the level the right
ureterovesicular junction where a 4 mm calculus is seen. No residual
urolithiasis.

Normal appendix.

  By: Jodine Inno
